# Patient Record
Sex: Female | Born: 1975 | Hispanic: Yes | Marital: Single | Smoking: Never smoker
Health system: Southern US, Community
[De-identification: ages and names within clinical notes are randomized; demographics above are authoritative.]

## PROBLEM LIST (undated history)

## (undated) HISTORY — PX: BREAST BIOPSY: SHX20

---

## 2020-10-23 ENCOUNTER — Ambulatory Visit: Payer: Self-pay | Attending: Oncology | Admitting: *Deleted

## 2020-10-23 ENCOUNTER — Ambulatory Visit
Admission: RE | Admit: 2020-10-23 | Discharge: 2020-10-23 | Disposition: A | Discharge status: Home / Self Care | Payer: Self-pay | Source: Ambulatory Visit | Attending: Oncology | Admitting: Oncology

## 2020-10-23 ENCOUNTER — Encounter: Payer: Self-pay | Admitting: *Deleted

## 2020-10-23 ENCOUNTER — Other Ambulatory Visit: Payer: Self-pay | Admitting: *Deleted

## 2020-10-23 ENCOUNTER — Other Ambulatory Visit: Payer: Self-pay

## 2020-10-23 VITALS — BP 104/67 | HR 75 | Temp 97.2°F | Ht 61.0 in | Wt 151.0 lb

## 2020-10-23 DIAGNOSIS — N63 Unspecified lump in unspecified breast: Secondary | ICD-10-CM

## 2020-10-23 DIAGNOSIS — N6452 Nipple discharge: Secondary | ICD-10-CM

## 2020-10-23 IMAGING — US US BREAST*R* LIMITED INC AXILLA
1 series · 2 of 2 positions shown · non-contrast
Comparison: None.

CLINICAL DATA: Here for evaluation of bilateral dark yellow nipple
discharge which has been present for 1 year. The nipple discharge is
primarily non spontaneous, but occasionally it is noted
spontaneously by the patient. The patient also reports nonfocal
breast pain in the lower and outer right breast.

EXAM:
DIGITAL DIAGNOSTIC BILATERAL MAMMOGRAM WITH TOMOSYNTHESIS AND CAD;
ULTRASOUND LEFT BREAST LIMITED; ULTRASOUND RIGHT BREAST LIMITED
TECHNIQUE: Bilateral digital diagnostic mammography and breast tomosynthesis
was performed. The images were evaluated with computer-aided
detection.; Targeted ultrasound examination of the left breast was
performed; Targeted ultrasound examination of the right breast was
performed

[Series 3: us breast*right* limited inc axilla · 0.06mm/px · 2 of 2 slices shown]
[im 1/2]
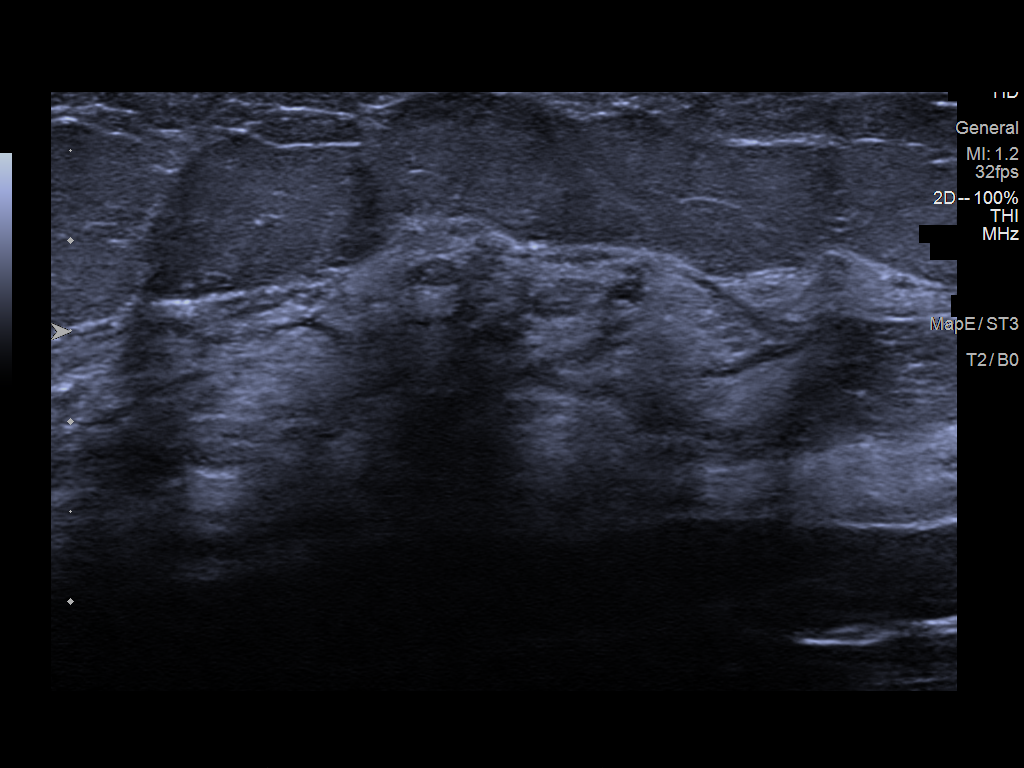
[im 2/2]
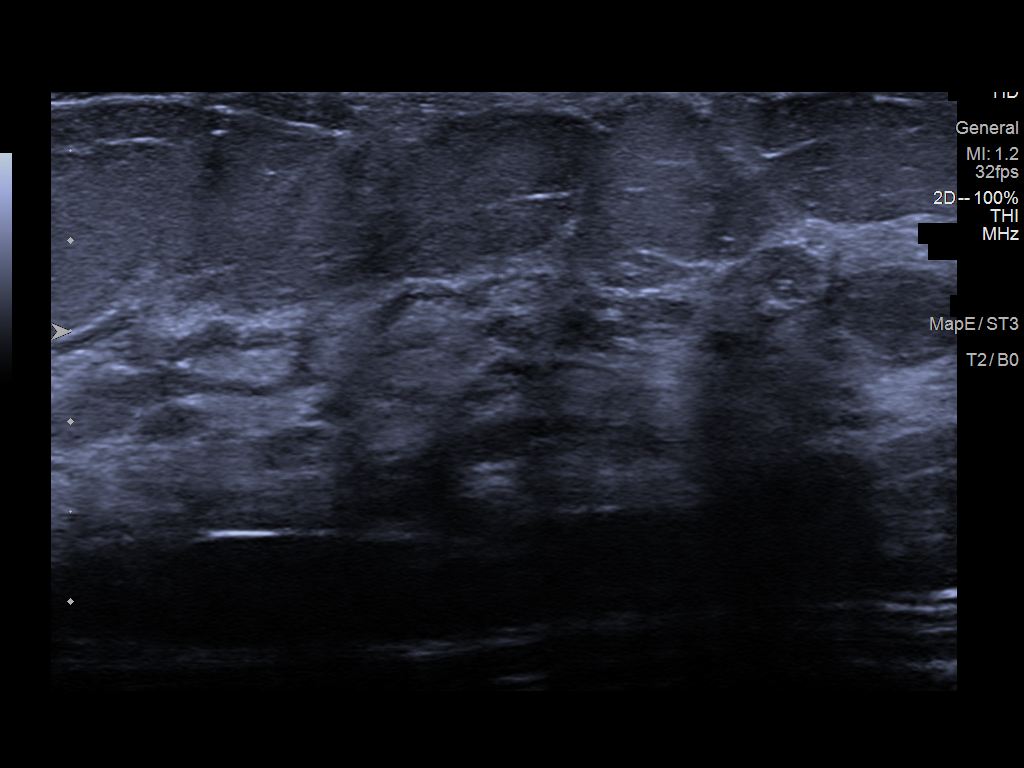

[2 of 2 positions shown; findings below may reference images not displayed]

ACR Breast Density Category c: The breast tissue is heterogeneously
dense, which may obscure small masses.
FINDINGS: Additional views including spot compression demonstrate a 5 mm oval
circumscribed mass in the lower outer left breast at posterior
depth.

Spot compression was obtained in the retroareolar area of both
breasts. No suspicious mammographic finding is identified in these
areas.

No suspicious mammographic finding is identified in the right breast
to explain the patient's symptoms. No suspicious mass,
microcalcification, or other finding is identified in the right
breast.

Targeted left breast ultrasound was performed in the area of the
mammographic mass. At 5 o'clock 2 cm from the nipple a benign simple
cyst measures 7 x 4 x 5 mm. This corresponds to the mass seen on the
mammogram.

Targeted bilateral retroareolar ultrasound was performed.

In the retroareolar left breast at 1 o'clock a hypoechoic
intraductal mass measures 5 x 4 x 4 mm. This does not have a
definite mammographic correlate. No abnormally enlarged left
axillary lymph node is identified.

In the retroareolar right breast, no suspicious solid or cystic mass
is identified. No findings to explain the patient's symptoms.

Targeted right breast ultrasound was performed in the area of pain
by the physician. No suspicious solid or cystic mass is identified.
No findings to explain the patient's symptoms.
IMPRESSION: 1. Hypoechoic intraductal mass in the left breast is suspicious for
malignancy.
2. No evidence of malignancy in the right breast or findings to
explain the patient's symptoms in the right breast.

RECOMMENDATION:
1. Recommend ultrasound-guided left breast biopsy.
2. Any further workup of the patient's symptoms should be based on
the clinical assessment.

I have discussed the findings and recommendations with the patient.
If applicable, a reminder letter will be sent to the patient
regarding the next appointment.

BI-RADS CATEGORY  4: Suspicious.

## 2020-10-23 IMAGING — MG DIGITAL DIAGNOSTIC BILAT W/ TOMO W/ CAD
8 of 16 series · 8 of 40 positions shown · non-contrast
Comparison: None.

CLINICAL DATA: Here for evaluation of bilateral dark yellow nipple
discharge which has been present for 1 year. The nipple discharge is
primarily non spontaneous, but occasionally it is noted
spontaneously by the patient. The patient also reports nonfocal
breast pain in the lower and outer right breast.

EXAM:
DIGITAL DIAGNOSTIC BILATERAL MAMMOGRAM WITH TOMOSYNTHESIS AND CAD;
ULTRASOUND LEFT BREAST LIMITED; ULTRASOUND RIGHT BREAST LIMITED
TECHNIQUE: Bilateral digital diagnostic mammography and breast tomosynthesis
was performed. The images were evaluated with computer-aided
detection.; Targeted ultrasound examination of the left breast was
performed; Targeted ultrasound examination of the right breast was
performed

[L MLO synth-2D (1 of 2)]
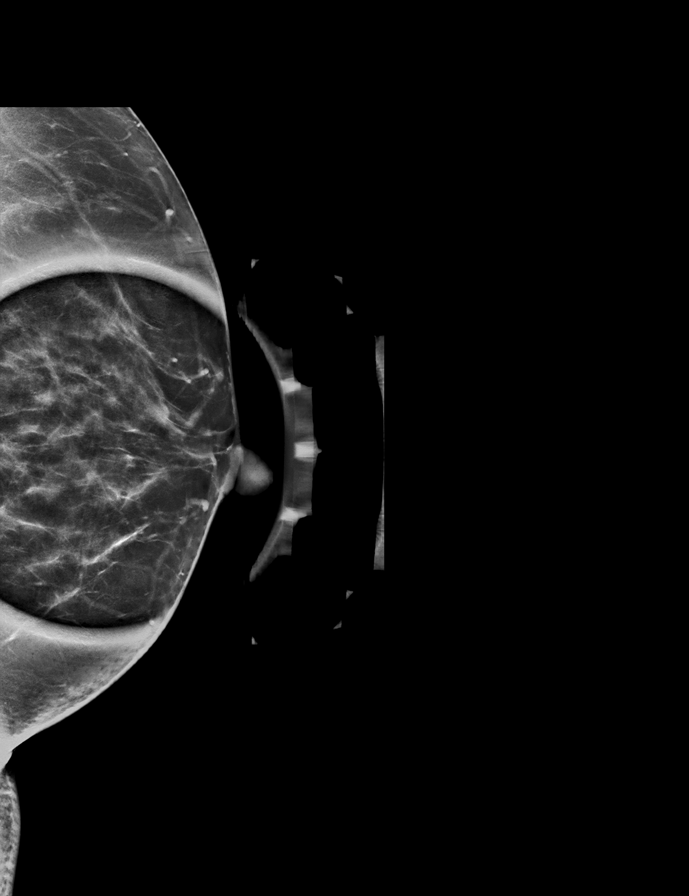

[L MLO synth-2D (2 of 2)]
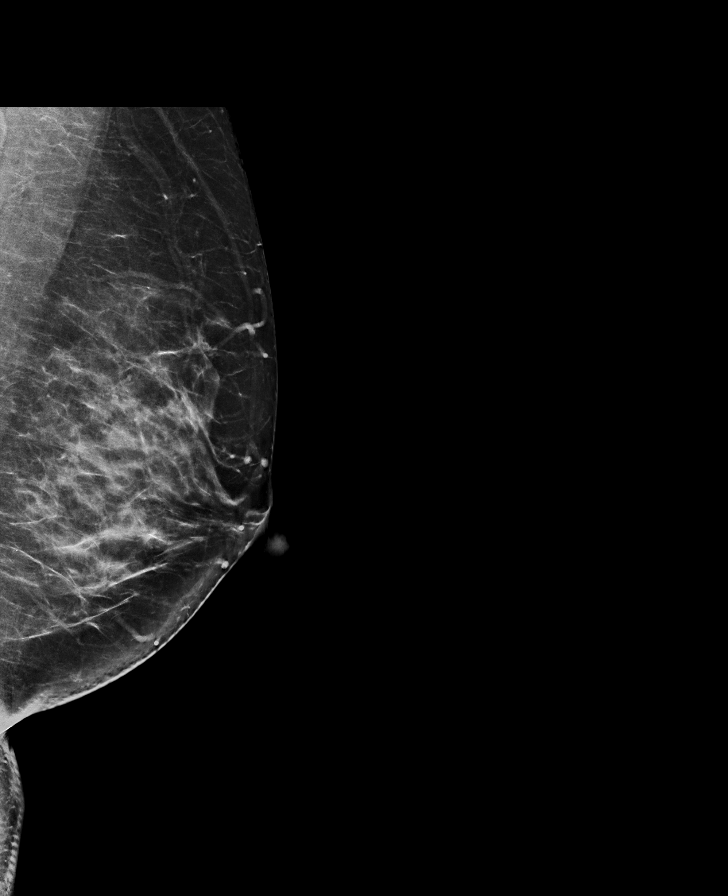

[R MLO synth-2D]
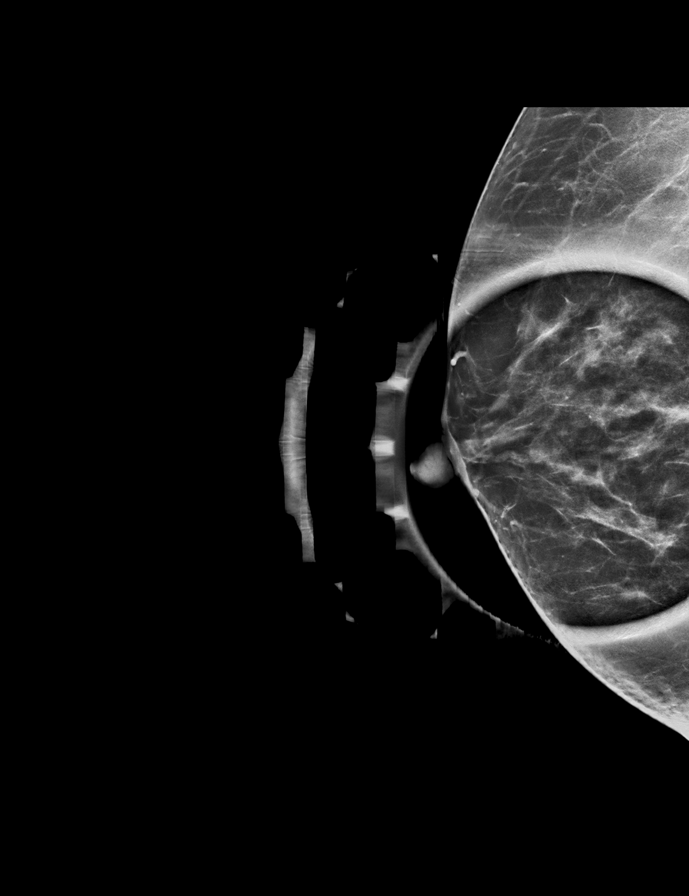

[L ML synth-2D]
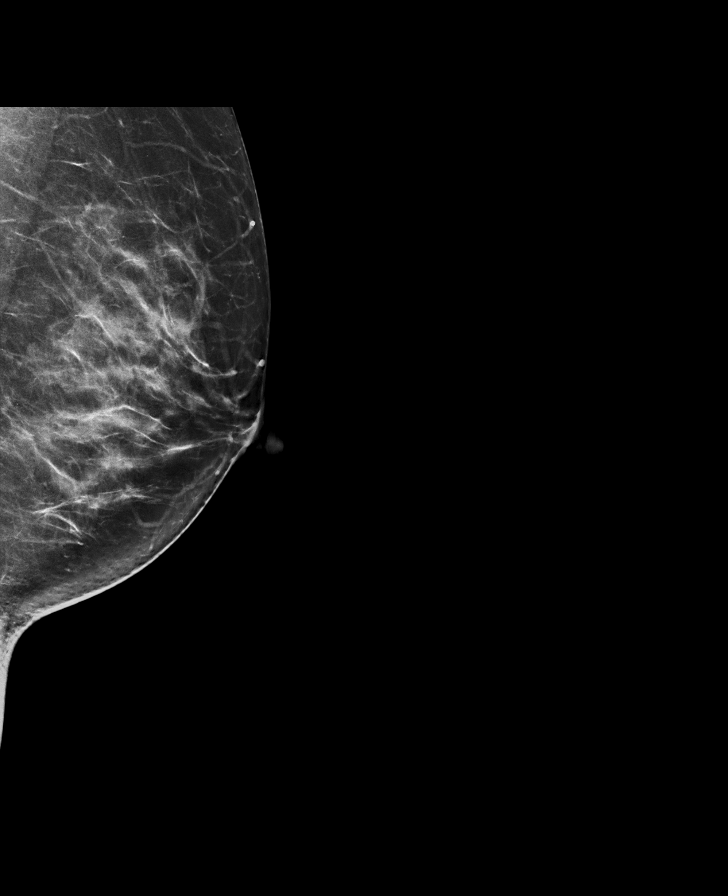

[L CC synth-2D (1 of 2)]
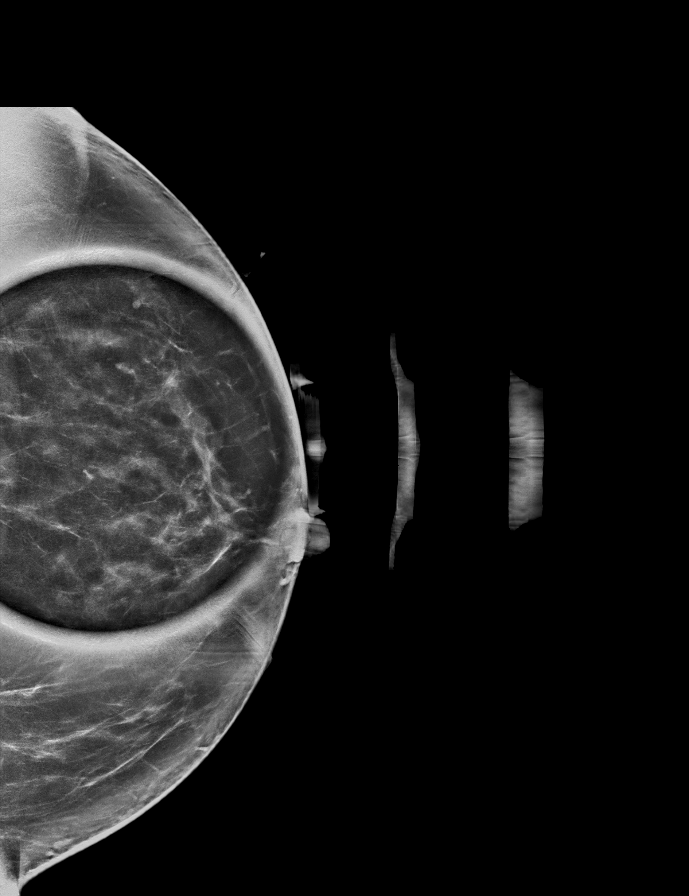

[R CC synth-2D]
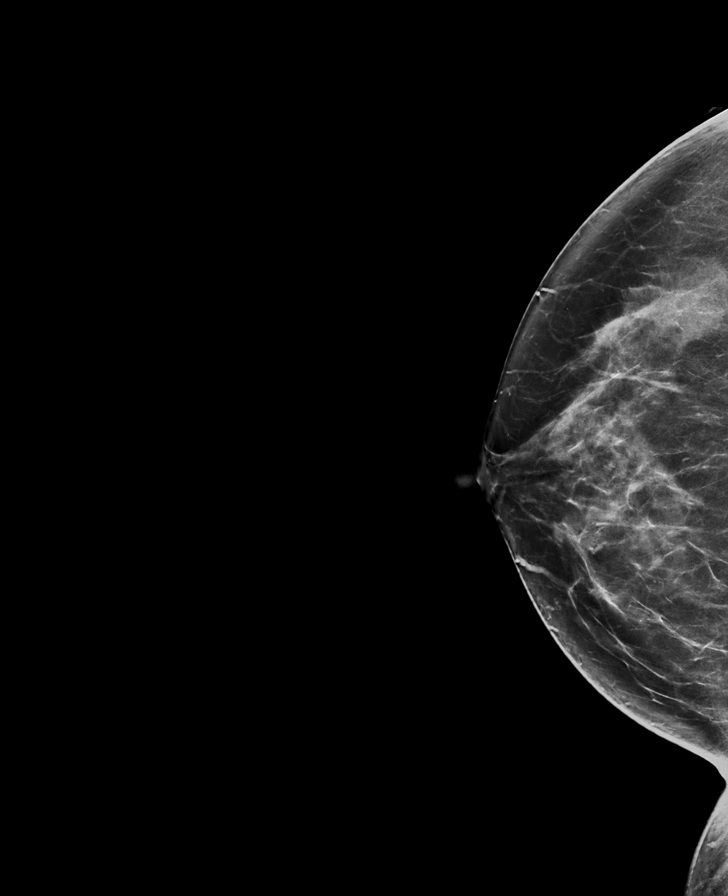

[L CC synth-2D (2 of 2)]
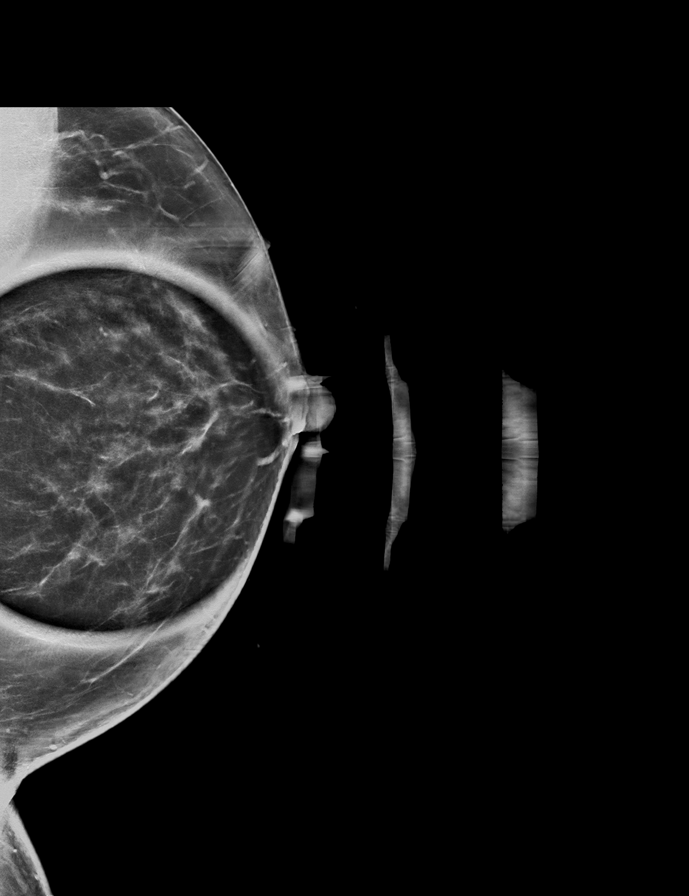

[R MLO tomo · tomo slice 33/65.0]
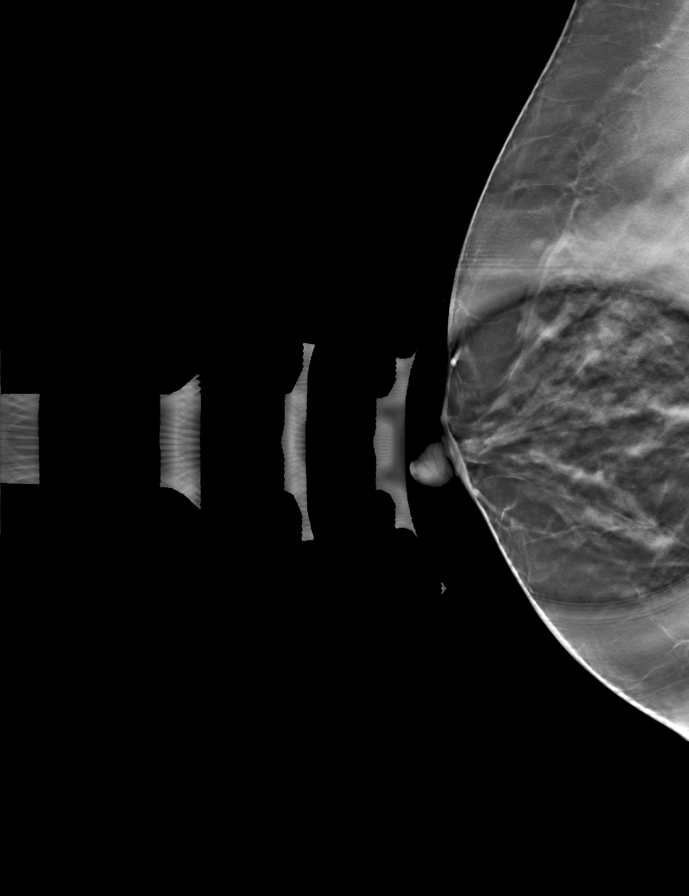

[8 of 40 positions shown; findings below may reference images not displayed]

ACR Breast Density Category c: The breast tissue is heterogeneously
dense, which may obscure small masses.
FINDINGS: Additional views including spot compression demonstrate a 5 mm oval
circumscribed mass in the lower outer left breast at posterior
depth.

Spot compression was obtained in the retroareolar area of both
breasts. No suspicious mammographic finding is identified in these
areas.

No suspicious mammographic finding is identified in the right breast
to explain the patient's symptoms. No suspicious mass,
microcalcification, or other finding is identified in the right
breast.

Targeted left breast ultrasound was performed in the area of the
mammographic mass. At 5 o'clock 2 cm from the nipple a benign simple
cyst measures 7 x 4 x 5 mm. This corresponds to the mass seen on the
mammogram.

Targeted bilateral retroareolar ultrasound was performed.

In the retroareolar left breast at 1 o'clock a hypoechoic
intraductal mass measures 5 x 4 x 4 mm. This does not have a
definite mammographic correlate. No abnormally enlarged left
axillary lymph node is identified.

In the retroareolar right breast, no suspicious solid or cystic mass
is identified. No findings to explain the patient's symptoms.

Targeted right breast ultrasound was performed in the area of pain
by the physician. No suspicious solid or cystic mass is identified.
No findings to explain the patient's symptoms.
IMPRESSION: 1. Hypoechoic intraductal mass in the left breast is suspicious for
malignancy.
2. No evidence of malignancy in the right breast or findings to
explain the patient's symptoms in the right breast.

RECOMMENDATION:
1. Recommend ultrasound-guided left breast biopsy.
2. Any further workup of the patient's symptoms should be based on
the clinical assessment.

I have discussed the findings and recommendations with the patient.
If applicable, a reminder letter will be sent to the patient
regarding the next appointment.

BI-RADS CATEGORY  4: Suspicious.

## 2020-10-23 IMAGING — US US BREAST*L* LIMITED INC AXILLA
1 series · 11 of 11 positions shown · non-contrast
Comparison: None.

CLINICAL DATA: Here for evaluation of bilateral dark yellow nipple
discharge which has been present for 1 year. The nipple discharge is
primarily non spontaneous, but occasionally it is noted
spontaneously by the patient. The patient also reports nonfocal
breast pain in the lower and outer right breast.

EXAM:
DIGITAL DIAGNOSTIC BILATERAL MAMMOGRAM WITH TOMOSYNTHESIS AND CAD;
ULTRASOUND LEFT BREAST LIMITED; ULTRASOUND RIGHT BREAST LIMITED
TECHNIQUE: Bilateral digital diagnostic mammography and breast tomosynthesis
was performed. The images were evaluated with computer-aided
detection.; Targeted ultrasound examination of the left breast was
performed; Targeted ultrasound examination of the right breast was
performed

[Series 1: us breast*left* limited inc axilla · 0.07mm/px · 11 of 11 slices shown]
[im 1/11]
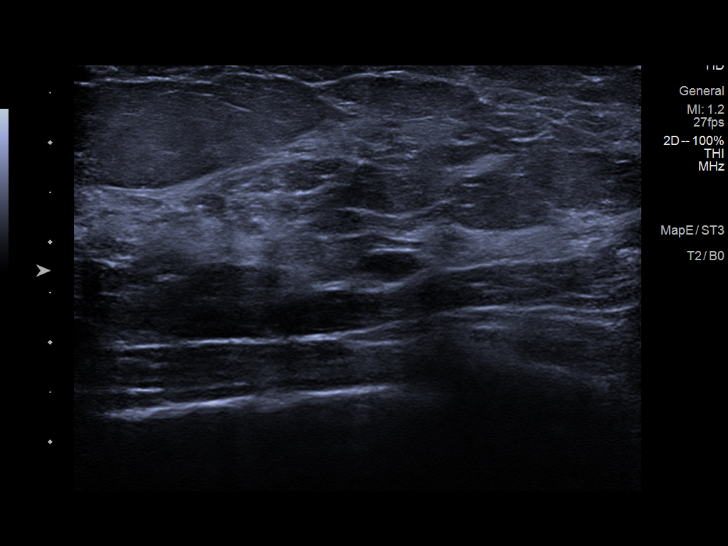
[im 2/11]
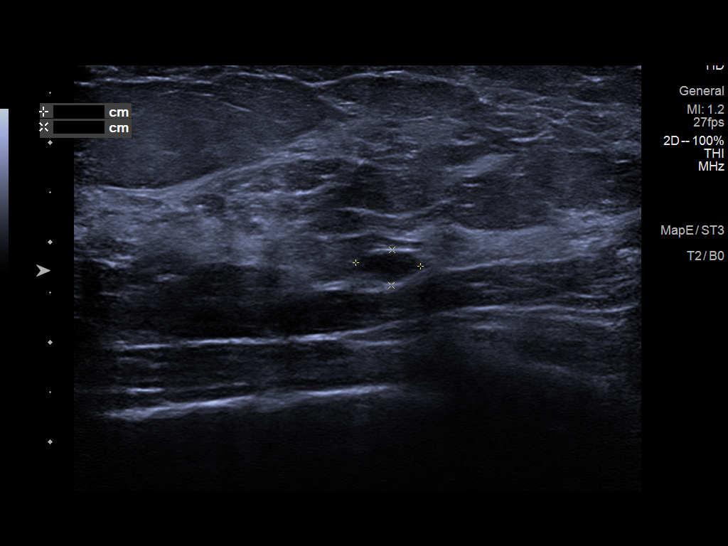
[im 3/11]
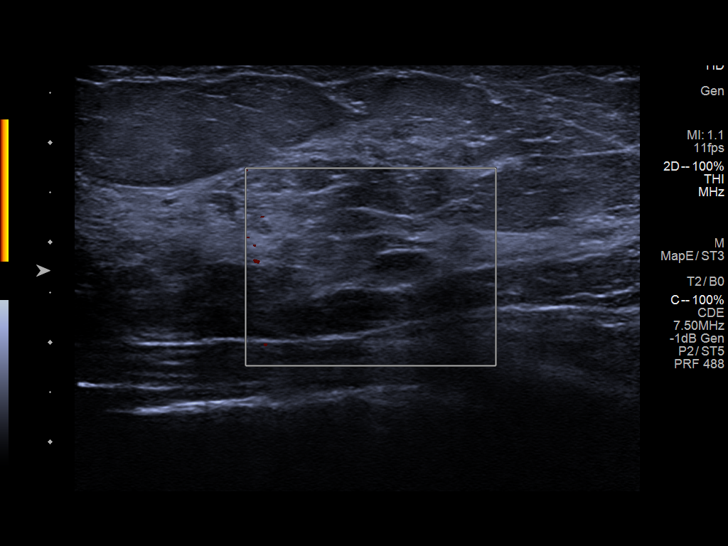
[im 4/11]
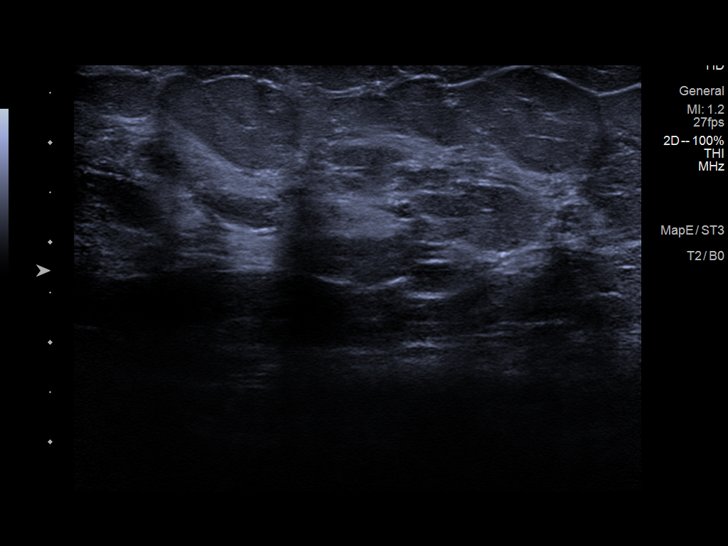
[im 5/11]
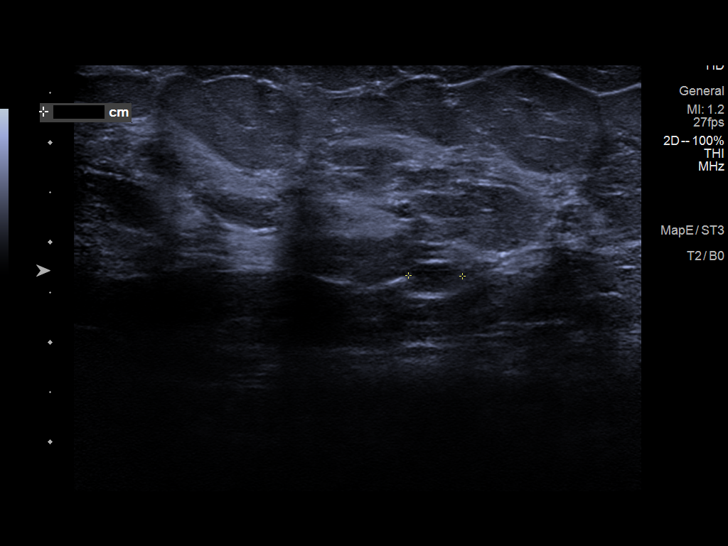
[im 6/11]
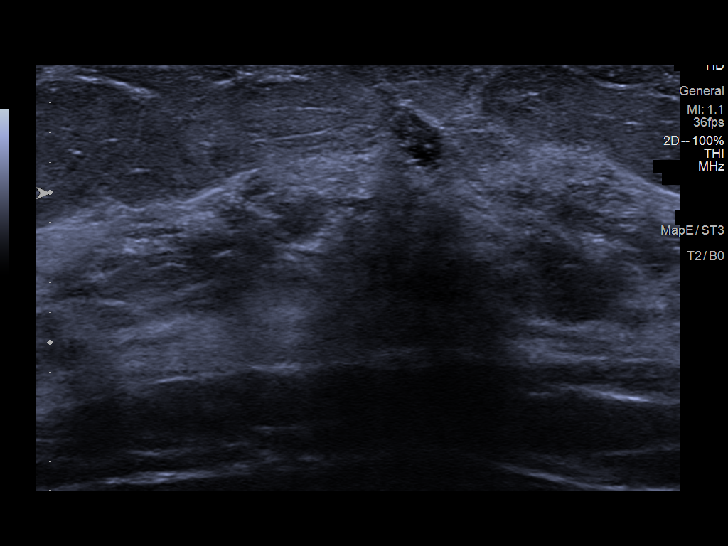
[im 7/11]
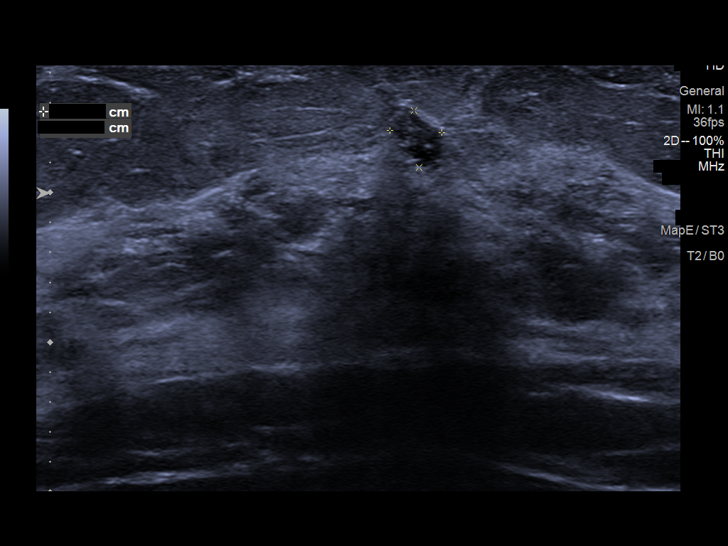
[im 8/11]
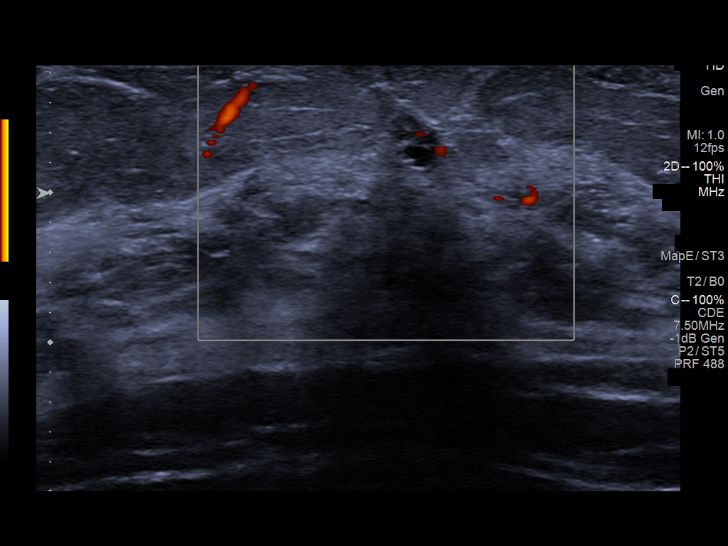
[im 9/11]
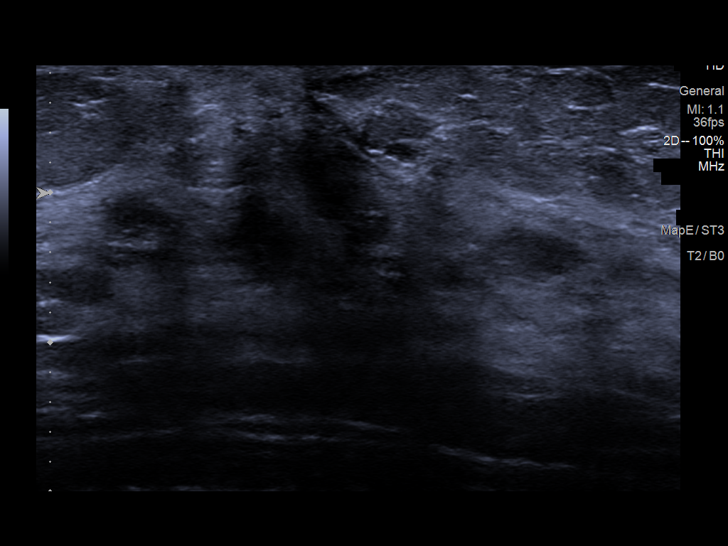
[im 10/11]
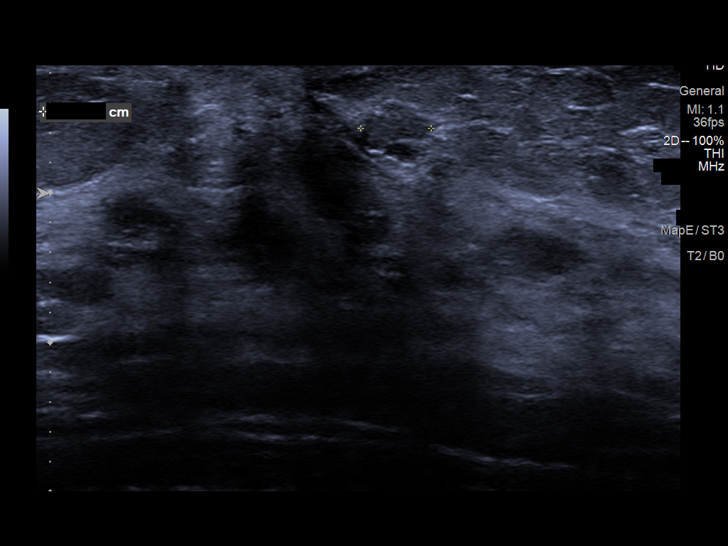
[im 11/11]
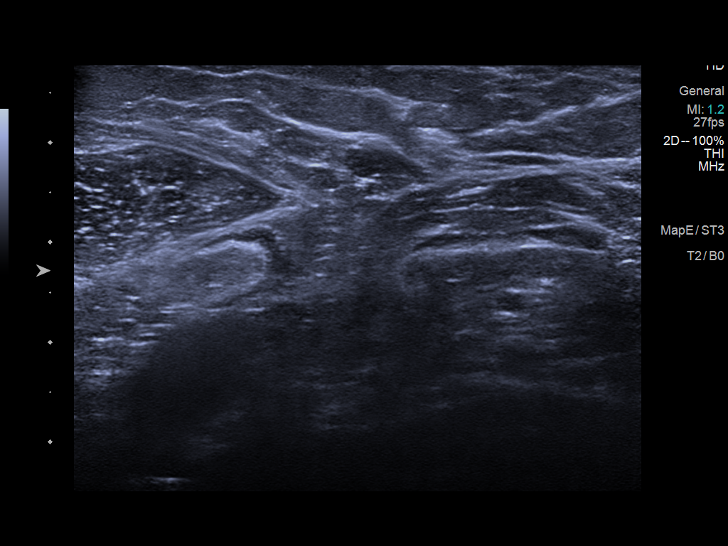

[11 of 11 positions shown; findings below may reference images not displayed]

ACR Breast Density Category c: The breast tissue is heterogeneously
dense, which may obscure small masses.
FINDINGS: Additional views including spot compression demonstrate a 5 mm oval
circumscribed mass in the lower outer left breast at posterior
depth.

Spot compression was obtained in the retroareolar area of both
breasts. No suspicious mammographic finding is identified in these
areas.

No suspicious mammographic finding is identified in the right breast
to explain the patient's symptoms. No suspicious mass,
microcalcification, or other finding is identified in the right
breast.

Targeted left breast ultrasound was performed in the area of the
mammographic mass. At 5 o'clock 2 cm from the nipple a benign simple
cyst measures 7 x 4 x 5 mm. This corresponds to the mass seen on the
mammogram.

Targeted bilateral retroareolar ultrasound was performed.

In the retroareolar left breast at 1 o'clock a hypoechoic
intraductal mass measures 5 x 4 x 4 mm. This does not have a
definite mammographic correlate. No abnormally enlarged left
axillary lymph node is identified.

In the retroareolar right breast, no suspicious solid or cystic mass
is identified. No findings to explain the patient's symptoms.

Targeted right breast ultrasound was performed in the area of pain
by the physician. No suspicious solid or cystic mass is identified.
No findings to explain the patient's symptoms.
IMPRESSION: 1. Hypoechoic intraductal mass in the left breast is suspicious for
malignancy.
2. No evidence of malignancy in the right breast or findings to
explain the patient's symptoms in the right breast.

RECOMMENDATION:
1. Recommend ultrasound-guided left breast biopsy.
2. Any further workup of the patient's symptoms should be based on
the clinical assessment.

I have discussed the findings and recommendations with the patient.
If applicable, a reminder letter will be sent to the patient
regarding the next appointment.

BI-RADS CATEGORY  4: Suspicious.

## 2020-10-23 NOTE — Progress Notes (Signed)
Subjective:     Patient ID: Sally Ellis, female   DOB: 04-Nov-1975, 45 y.o.   MRN: [id number]  HPI   [facility] Medical History Record - 10/23/20 2725      Breast History   Screening cycle New    Provider ([facility]) [facility]    Initial Mammogram 10/23/20    Last Mammogram Never    Recent Breast Symptoms Pain   Bilateral pain more right; Milky nipple discharge expressed only     Breast Cancer History   Breast Cancer History No personal or family history    Comments/Details father prostate      Previous History of Breast Problems   Breast Surgery or Biopsy None    Breast Implants N/A    BSE Done Never      Gynecological/Obstetrical History   LMP --   2 years   Is there any chance that the client could be pregnant?  No    Age at menarche 71    Age at menopause perimenopausal  Possibly Menopausal    PAP smear history Annually    Date of last PAP  --   10/2020   Provider (PAP) [facility]    Age at first live birth 77    Breast fed children Yes (type length in comments)   1 year   DES Exposure No    Cervical, Uterine or Ovarian cancer No    Family history of Cervial, Uterine or Ovarian cancer Yes   p- aunts 2 pelvic   Hysterectomy No    Cervix removed No    Ovaries removed No    Laser/Cryosurgery No   2 years ago sent to [facility] for abnormal pap colposcopy; benign biopsy?   Current method of birth control None    Current method of Estrogen/Hormone replacement None    Smoking history None            Review of Systems     Objective:   Physical Exam Chest:  Breasts:     Right: Nipple discharge present. No swelling, bleeding, inverted nipple, mass, skin change, tenderness, axillary adenopathy or supraclavicular adenopathy.     Left: Nipple discharge present. No swelling, bleeding, inverted nipple, mass, skin change, axillary adenopathy or supraclavicular adenopathy.     Lymphadenopathy:     Upper Body:     Right upper body: No  supraclavicular or axillary adenopathy.     Left upper body: No supraclavicular or axillary adenopathy.        Assessment:     45 year old Hispanic female referred to [facility] by the [facility] for further evaluation of bilateral breast masses and nipple discharge.  Jacqui, the interpreter present during the interview and exam.  Patient states she has intermittent bilateral breast masses and nipple discharge on expression only.  States this has been going on for about 1 year.  On clinical breast exam there is no dominant mass, skin changes, lymphadenopathy or spontaneous nipple discharge.  I asked the patient to find the area of palpable concern.  She is not able to find a mass on either breast.  States "they come and go".  She is able to express some clear/tan nipple discharge from a single duct from each nipple.  Taught self breast awareness.    Risk Assessment    Risk Scores      10/23/2020   Last edited by: Scarlett Presto, RN   5-year risk: 0.5 %   Lifetime risk: 5.6 %  Plan:     Will get bilateral diagnostic mammogram and ultrasound.  Will follow up per [facility] protocol.

## 2020-10-23 NOTE — Patient Instructions (Signed)
Gave patient hand-out, Women Staying Healthy, Active and Well from [facility], with education on breast health, pap smears, heart and colon health. 

## 2020-11-01 ENCOUNTER — Ambulatory Visit: Payer: Self-pay

## 2020-11-12 ENCOUNTER — Ambulatory Visit
Admission: RE | Admit: 2020-11-12 | Discharge: 2020-11-12 | Disposition: A | Discharge status: Home / Self Care | Payer: Self-pay | Source: Ambulatory Visit | Attending: Oncology | Admitting: Oncology

## 2020-11-12 ENCOUNTER — Ambulatory Visit: Payer: Self-pay

## 2020-11-12 ENCOUNTER — Other Ambulatory Visit: Payer: Self-pay | Admitting: *Deleted

## 2020-11-12 ENCOUNTER — Other Ambulatory Visit: Payer: Self-pay

## 2020-11-12 DIAGNOSIS — N63 Unspecified lump in unspecified breast: Secondary | ICD-10-CM

## 2020-11-12 HISTORY — PX: BREAST BIOPSY: SHX20

## 2020-11-12 IMAGING — MG DIGITAL DIAGNOSTIC UNILAT LEFT W/ CAD
4 series · 4 of 12 positions shown · non-contrast
Comparison: Previous exam(s).

CLINICAL DATA: Patient status post ultrasound-guided core needle
biopsy retroareolar left breast mass.

EXAM:
DIAGNOSTIC LEFT MAMMOGRAM POST ULTRASOUND BIOPSY

[L ML synth-2D]
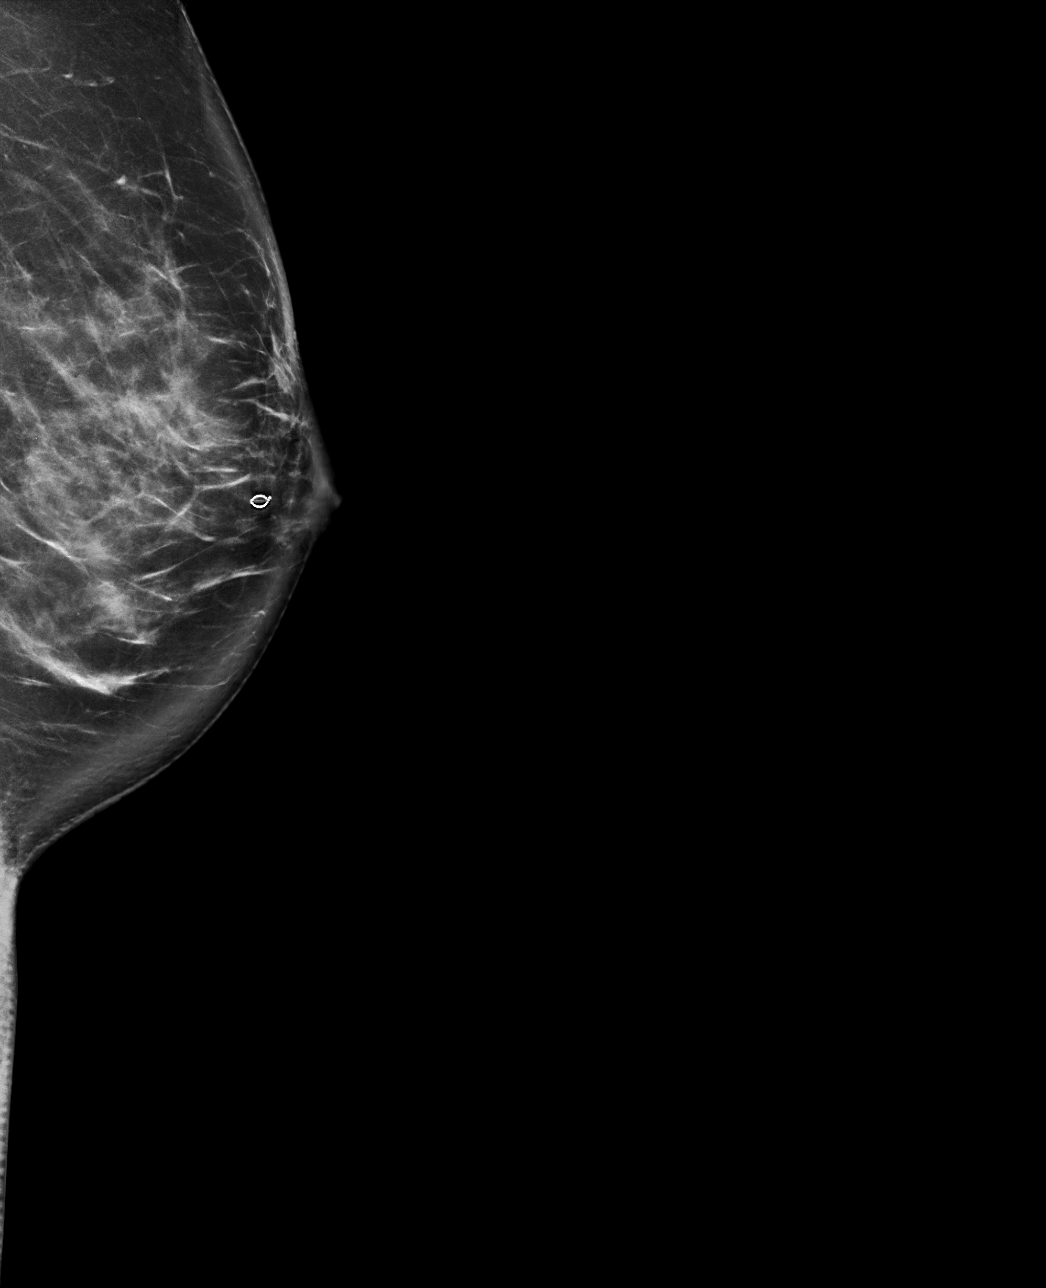

[L CC synth-2D]
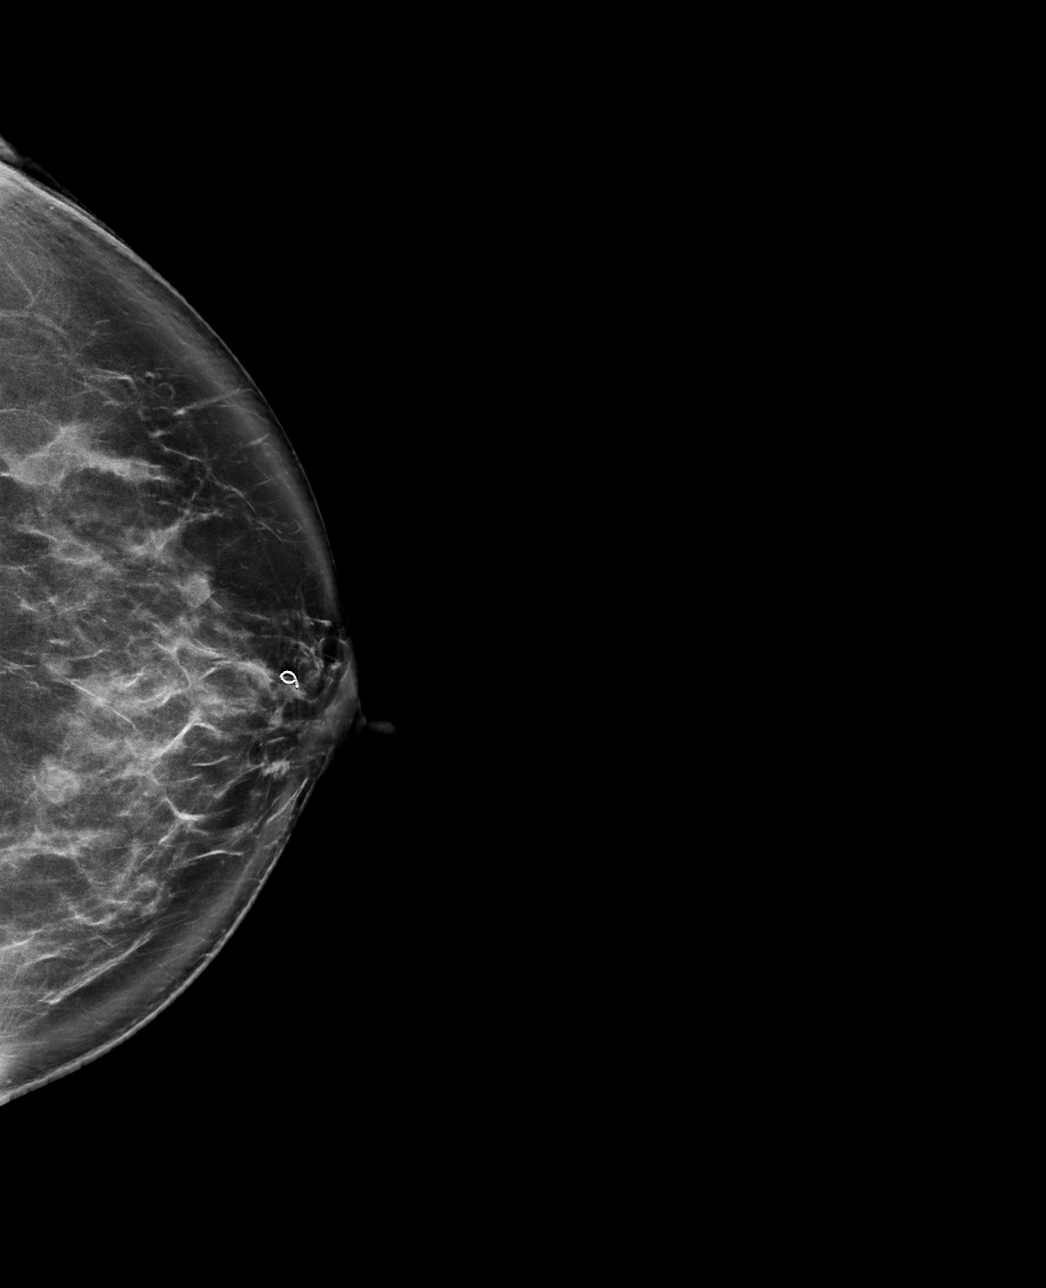

[L ML tomo · tomo slice 53/105.0]
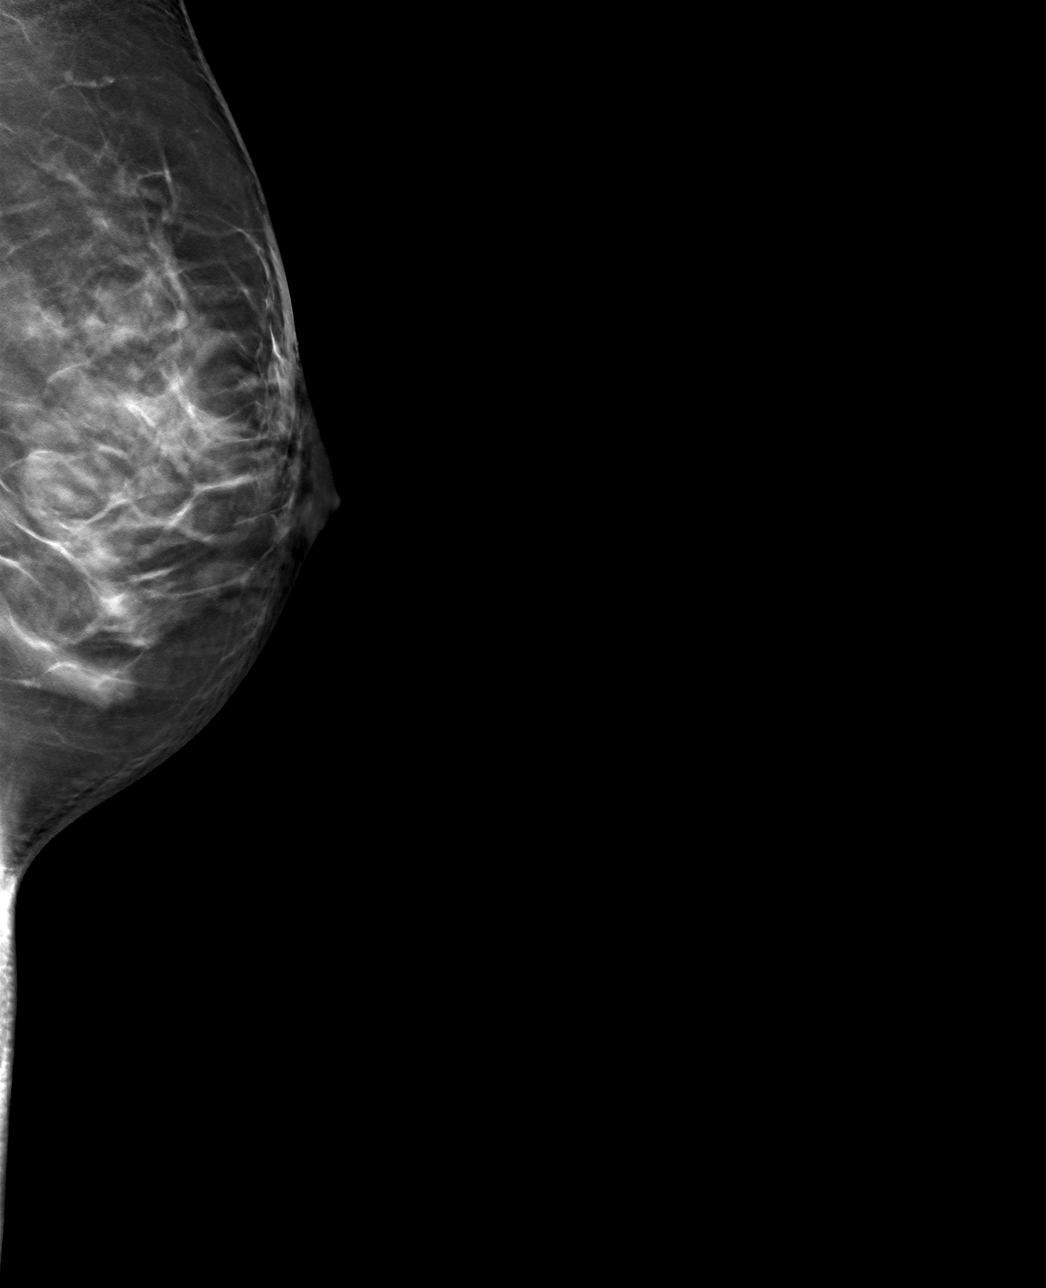

[L CC tomo · tomo slice 53/105.0]
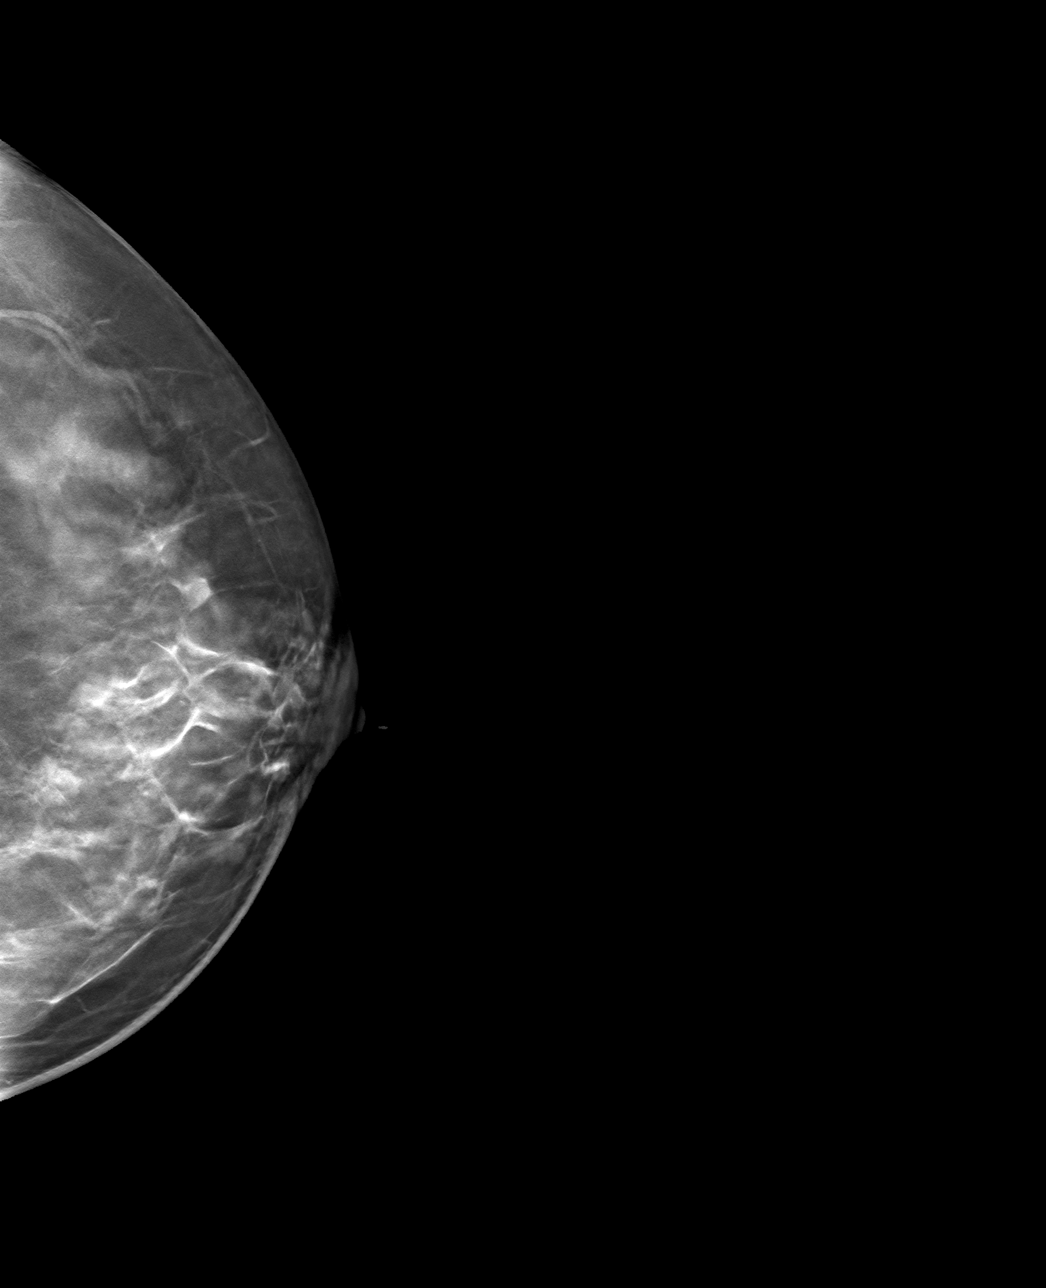

[4 of 12 positions shown; findings below may reference images not displayed]

FINDINGS: Mammographic images were obtained following ultrasound guided biopsy
of left breast mass 1 o'clock position. The biopsy marking clip is
in expected position at the site of biopsy.
IMPRESSION: Appropriate positioning of the Q shaped biopsy marking clip at the
site of biopsy in the left breast mass 1 o'clock position.

Final Assessment: Post Procedure Mammograms for Marker Placement

## 2020-11-12 IMAGING — MG US BREAST BX W LOC DEV 1ST LESION IMG BX SPEC US GUIDE*L*
1 series · 5 of 5 positions shown · non-contrast
Comparison: Previous exam(s).
COMPARISON: Previous exam(s).

Addendum:
CLINICAL DATA: Patient with indeterminate mass left breast 1
o'clock position.

EXAM:
ULTRASOUND GUIDED LEFT BREAST CORE NEEDLE BIOPSY

[Series 1: MG view · 0.05mm/px · 5 of 5 slices shown]
[im 1/5]
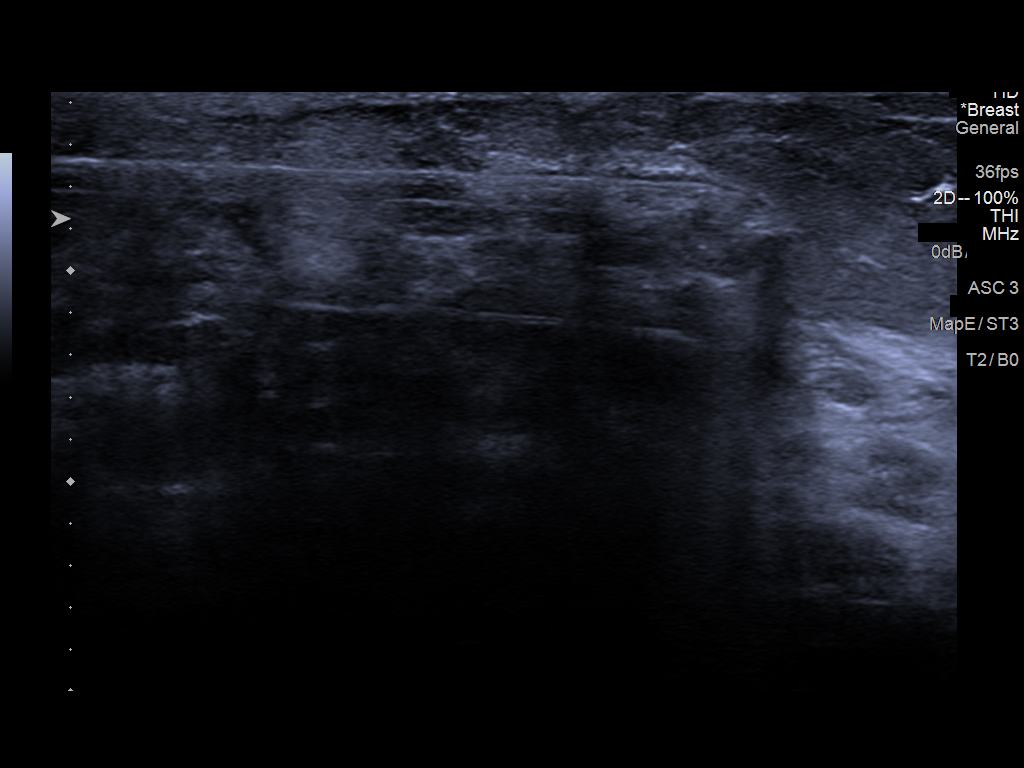
[im 2/5]
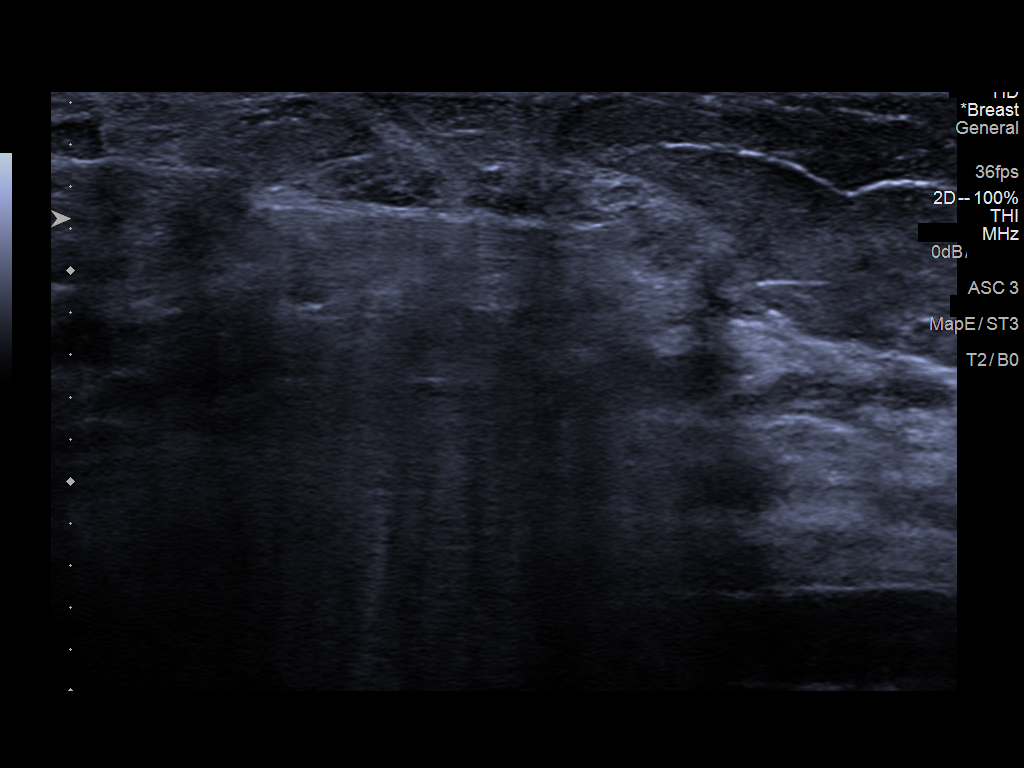
[im 3/5]
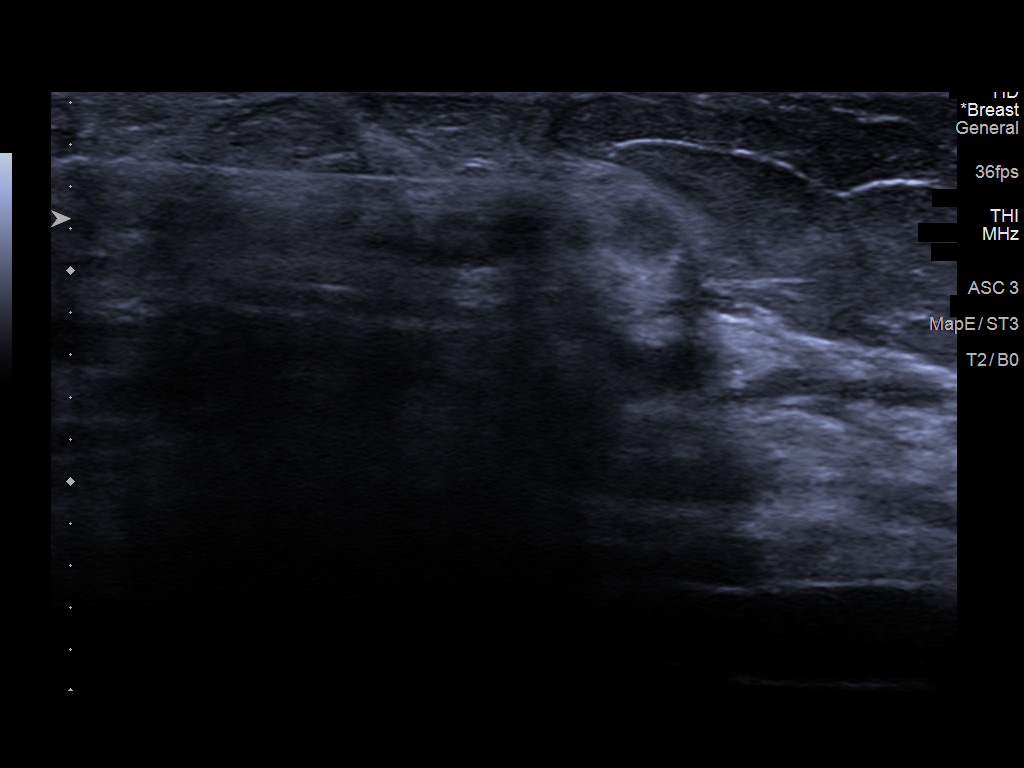
[im 4/5]
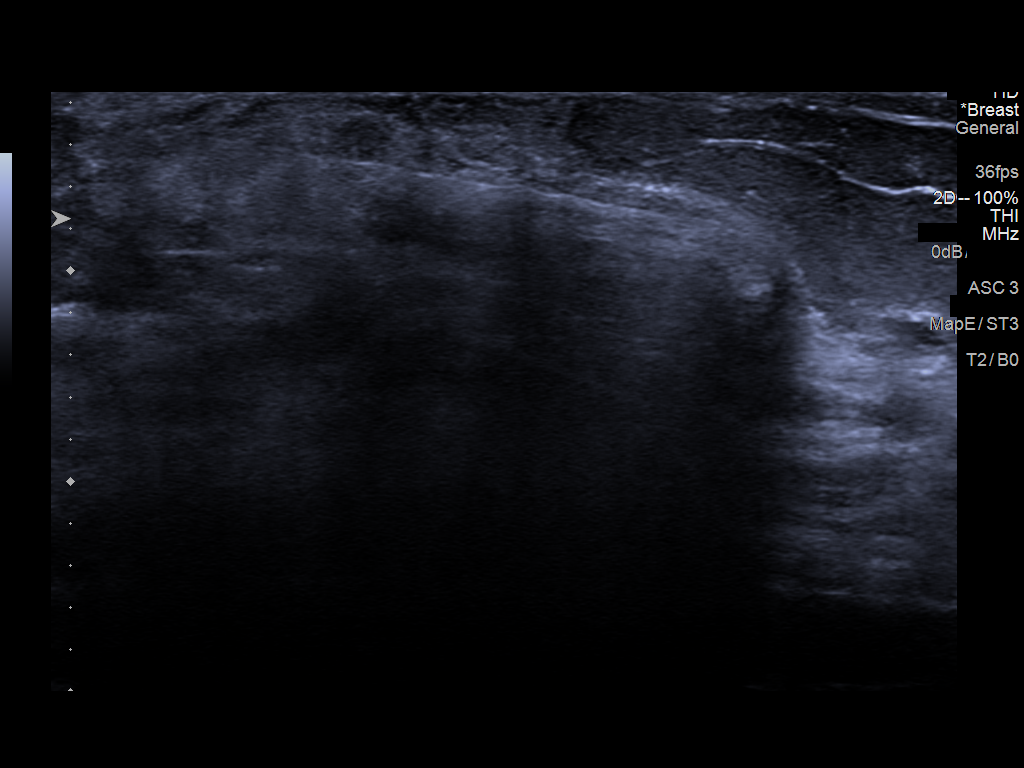
[im 5/5]
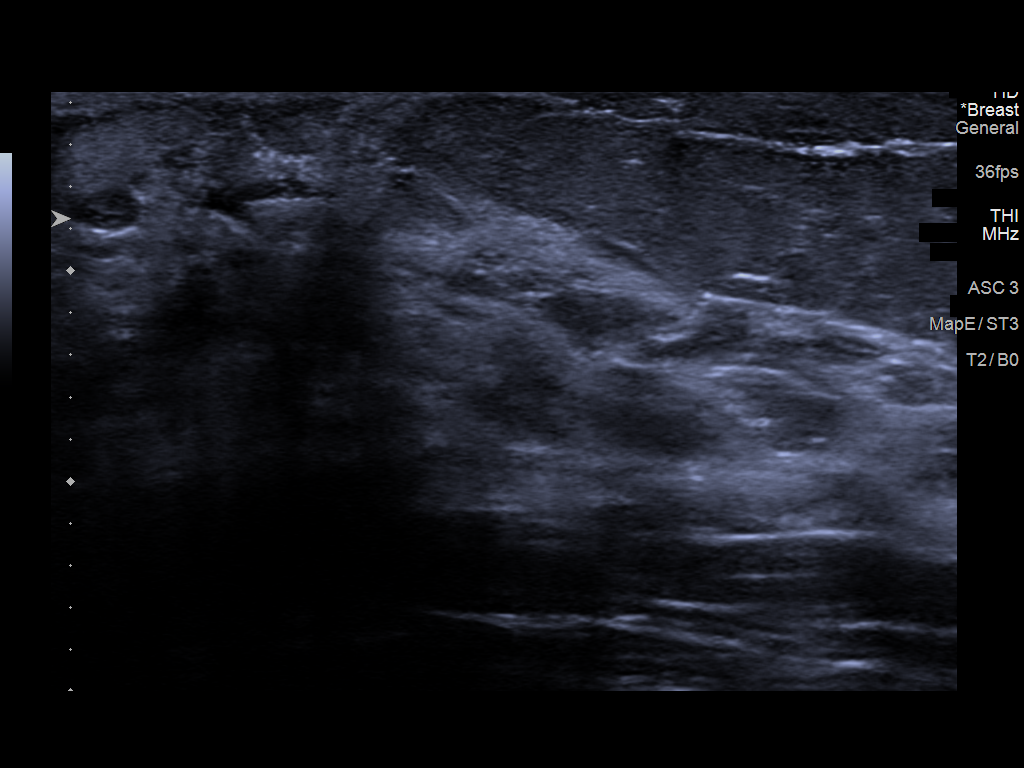

[5 of 5 positions shown; findings below may reference images not displayed]



Lesion quadrant: Upper outer quadrant

Using sterile technique and 1% Lidocaine as local anesthetic, under
direct ultrasound visualization, a 14 gauge SCHILL device was
used to perform biopsy of left breast mass 1 o'clock position using
a lateral approach. At the conclusion of the procedure Q shaped
tissue marker clip was deployed into the biopsy cavity. Follow up 2
view mammogram was performed and dictated separately.
IMPRESSION: Ultrasound guided biopsy of left breast mass 1 o'clock position. No
apparent complications.

ADDENDUM:
PATHOLOGY revealed: A. LEFT BREAST, RETROAREOLAR; ULTRASOUND-GUIDED
BIOPSY: - BENIGN BREAST TISSUE WITH FIBROCYSTIC CHANGES AND
EXTENSIVE PAPILLARY APOCRINE METAPLASIA. - NEGATIVE FOR ATYPIA AND
MALIGNANCY.

Pathology results are CONCORDANT with imaging findings, per Dr. SCHILL
SCHILL.

Pathology results and recommendations below were discussed with
patient via [employer] # [[phone]] by telephone on [DATE].
Patient reported biopsy site within normal limits with slight
tenderness at the site, and no significant bruising. Patient stated
she continues to have small amount of LEFT nipple discharge. Post
biopsy care instructions were reviewed, questions were answered and
my direct phone number was provided to patient. Patient was
instructed to call [HOSPITAL] if any concerns or
questions arise related to the biopsy.

Recommendation: RECOMMEND bilateral breast MRI due to persistent
LEFT breast nipple discharge with benign biopsy results.

Pathology results reported by SCHILL RN on [DATE].



Lesion quadrant: Upper outer quadrant

Using sterile technique and 1% Lidocaine as local anesthetic, under
direct ultrasound visualization, a 14 gauge SCHILL device was
used to perform biopsy of left breast mass 1 o'clock position using
a lateral approach. At the conclusion of the procedure Q shaped
tissue marker clip was deployed into the biopsy cavity. Follow up 2
view mammogram was performed and dictated separately.
IMPRESSION: Ultrasound guided biopsy of left breast mass 1 o'clock position. No
apparent complications.

## 2020-11-13 LAB — SURGICAL PATHOLOGY

## 2020-11-22 ENCOUNTER — Other Ambulatory Visit: Payer: Self-pay | Admitting: *Deleted

## 2020-11-22 DIAGNOSIS — N6452 Nipple discharge: Secondary | ICD-10-CM

## 2020-11-25 ENCOUNTER — Telehealth: Payer: Self-pay | Admitting: *Deleted

## 2020-11-25 NOTE — Telephone Encounter (Signed)
VM TO SCHD BIL BR MRI

## 2020-12-24 ENCOUNTER — Ambulatory Visit: Admission: RE | Admit: 2020-12-24 | Payer: Self-pay | Source: Ambulatory Visit

## 2021-01-23 ENCOUNTER — Encounter: Payer: Self-pay | Admitting: *Deleted

## 2021-01-23 NOTE — Progress Notes (Signed)
Spoke to patient today via [employer] manager [phone].  Patient states she cancelled her MRI appointment because she had received bills for her mammogram.  She was instructed to bring her bills to the cancer center so I could correct the billing.  She is agreeable to reschedule her breast MRI.  She is call or bring me bill for her MRI if she receives one.  Inbasket message sent to Madelynn Done in the breast center to schedule patient's MRI.

## 2021-02-12 ENCOUNTER — Other Ambulatory Visit: Payer: Self-pay

## 2021-02-12 ENCOUNTER — Ambulatory Visit
Admission: RE | Admit: 2021-02-12 | Discharge: 2021-02-12 | Disposition: A | Discharge status: Home / Self Care | Payer: Self-pay | Source: Ambulatory Visit | Attending: Oncology | Admitting: Oncology

## 2021-02-12 DIAGNOSIS — N6452 Nipple discharge: Secondary | ICD-10-CM

## 2021-02-12 IMAGING — MR MR BREAST BILAT WO/W CM
2 of 13 series · 6 of 48 positions shown · IV contrast (7ml Gadavist)
Comparison: Bilateral diagnostic mammogram and bilateral breast
ultrasound dated [DATE] and left breast ultrasound-guided core
needle biopsy dated [DATE].

CLINICAL DATA: Bilateral non spontaneous dark brown nipple
discharge for the past 5 years. At the time of a bilateral
diagnostic mammogram and breast ultrasound dated [DATE], the
patient reported bilateral dark yellow nipple discharge for the year
which was primarily non spontaneous SPECT occasionally noted
spontaneously by the patient. At that time, the patient had 5 mm
intraductal mass in the 1 o'clock position of retroareolar left
breast that was biopsied under ultrasound guidance on [DATE]
with pathology results of benign breast tissue with fibrocystic
changes and extensive papillary apocrine metaplasia.

LABS:  None obtained on site today.
EXAM:
BILATERAL BREAST MRI WITH AND WITHOUT CONTRAST
TECHNIQUE: Multiplanar, multisequence MR images of both breasts were obtained
prior to and following the intravenous administration of 7.5 ml of
Gadavist

[Series 2: T1 · axial · B · 1.5mm · 1.02mm/px · z∈[-46,+120]mm · 5 of 112 slices shown]
[im 1/112]
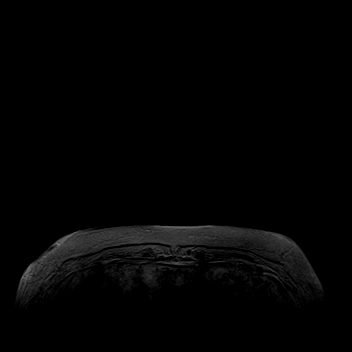
[im 28/112]
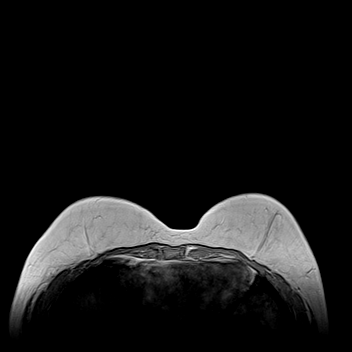
[im 56/112]
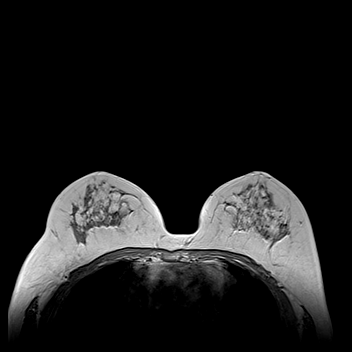
[im 84/112]
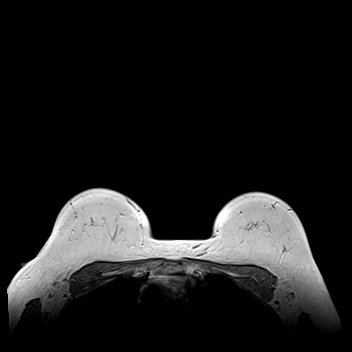
[im 112/112]
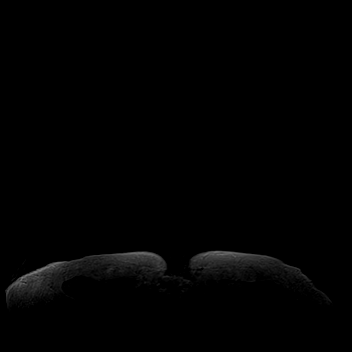

[Series 3: T2 · axial · B · 3.0mm · 1.02mm/px · 1 of 46 slices shown]
[im 1/46]
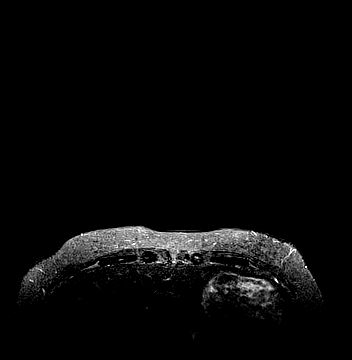

[6 of 48 positions shown; findings below may reference images not displayed]

Three-dimensional MR images were rendered by post-processing of the
original MR data on an independent workstation. The
three-dimensional MR images were interpreted, and findings are
reported in the following complete MRI report for this study. Three
dimensional images were evaluated at the independent interpreting
workstation using the DynaCAD thin client.
FINDINGS: Breast composition: c. Heterogeneous fibroglandular tissue.

Background parenchymal enhancement: Mild bilateral nodular
background parenchymal enhancement.

Right breast: Enhancing mass-like area in 12 o'clock retroareolar
right breast measuring 1.3 x 1.1 cm on image number 70/6. This
measures in 1.2 cm cephalo caudal dimension has persistent
enhancement kinetics. There is also a small amount patchy non mass
enhancement in the medial right breast with appearance compatible
with normal fibroglandular tissue or fibrocystic tissue. Also noted
are multiple small cysts.

Left breast: Areas of patchy non mass enhancement in the central
breast, similar to the small area seen in the medial right breast
except larger on the left. One of these slightly medially has a more
linear branching configuration, measuring 1.2 x 1.1 cm on image
number 65/6. This measures 1.2 cm in cephalo caudal dimension with
low-grade persistent enhancement kinetics. Also noted are multiple
small cysts.

Lymph nodes: No abnormal appearing lymph nodes.

Ancillary findings:  None.
IMPRESSION: 1. 1.3 x 1.2 x 1.1 cm nonspecific enhancing mass-like area in the 12
o'clock retroareolar right breast.
2. 1.2 x 1.2 x 1.1 cm nonspecific linear branching non mass
enhancement slightly medial central left breast. This is suspicious
for the possibility of DCIS.

RECOMMENDATION:
1. MR guided core needle biopsy of the 1 3 cm enhancing mass-like
area in the 12 o'clock retroareolar right breast.
2. MR guided core needle biopsy of the 1.2 cm nonspecific linear
branching non mass enhancement in the slightly medial central left
breast.

BI-RADS CATEGORY  4: Suspicious.

## 2021-02-12 MED ORDER — GADOBUTROL 1 MMOL/ML IV SOLN
7.0000 mL | Freq: Once | INTRAVENOUS | Status: AC | PRN
  Administered 2021-02-12: 16:00:00 7 mL via INTRAVENOUS

## 2021-02-13 ENCOUNTER — Other Ambulatory Visit: Payer: Self-pay | Admitting: *Deleted

## 2021-02-13 DIAGNOSIS — N63 Unspecified lump in unspecified breast: Secondary | ICD-10-CM

## 2021-02-14 ENCOUNTER — Encounter: Payer: Self-pay | Admitting: *Deleted

## 2021-02-14 ENCOUNTER — Other Ambulatory Visit: Payer: Self-pay | Admitting: Oncology

## 2021-02-14 DIAGNOSIS — N63 Unspecified lump in unspecified breast: Secondary | ICD-10-CM

## 2021-02-14 NOTE — Progress Notes (Signed)
Patient with a birads 4 MRI.  Scheduled patient for an MRI guided biopsy on 02/24/21 at 6:40 at [address]   She is to follow previous MRI instructions.  Called patient via Severiano Gilbert #[phone] at [facility] interpreters to inform of her appointment.  No answer.  Will have Joellyn Quails call patient next week.

## 2021-02-24 ENCOUNTER — Ambulatory Visit
Admission: RE | Admit: 2021-02-24 | Discharge: 2021-02-24 | Disposition: A | Discharge status: Home / Self Care | Payer: Self-pay | Source: Ambulatory Visit | Attending: Oncology | Admitting: Oncology

## 2021-02-24 ENCOUNTER — Ambulatory Visit
Admission: RE | Admit: 2021-02-24 | Discharge: 2021-02-24 | Disposition: A | Discharge status: Home / Self Care | Payer: No Typology Code available for payment source | Source: Ambulatory Visit | Attending: Oncology | Admitting: Oncology

## 2021-02-24 ENCOUNTER — Other Ambulatory Visit: Payer: Self-pay

## 2021-02-24 DIAGNOSIS — N63 Unspecified lump in unspecified breast: Secondary | ICD-10-CM

## 2021-02-24 HISTORY — PX: BREAST BIOPSY: SHX20

## 2021-02-24 IMAGING — MG MM BREAST LOCALIZATION CLIP
4 series · 4 of 12 positions shown · non-contrast
Comparison: Previous exam(s).

CLINICAL DATA: Status post MR guided core biopsies of both breast.

EXAM:
3D DIAGNOSTIC BILATERAL MAMMOGRAM POST MRI BIOPSIES

[R CC synth-2D]
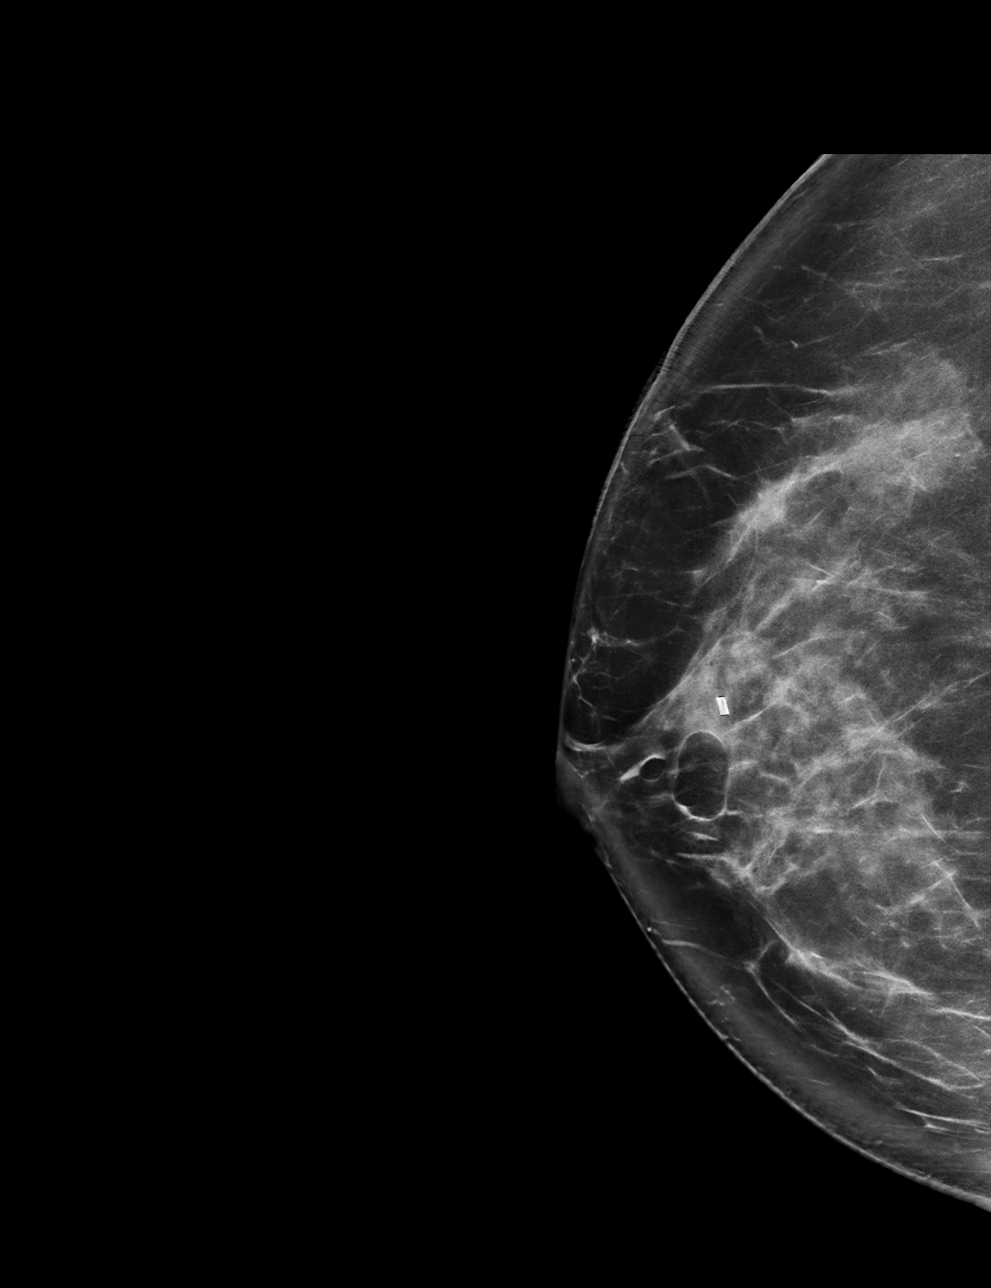

[R ML synth-2D]
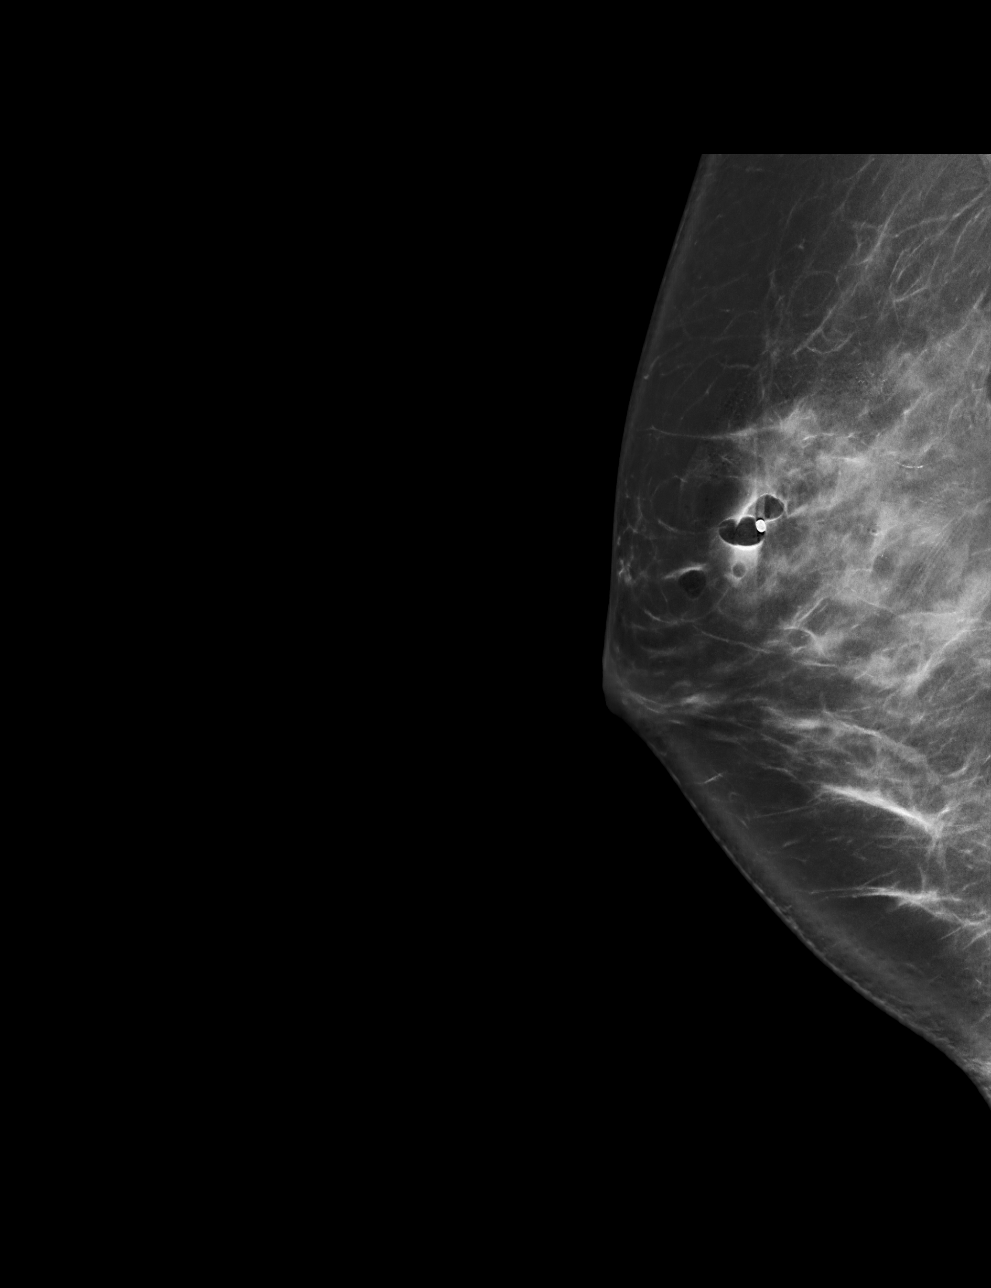

[R CC tomo · tomo slice 45/89.0]
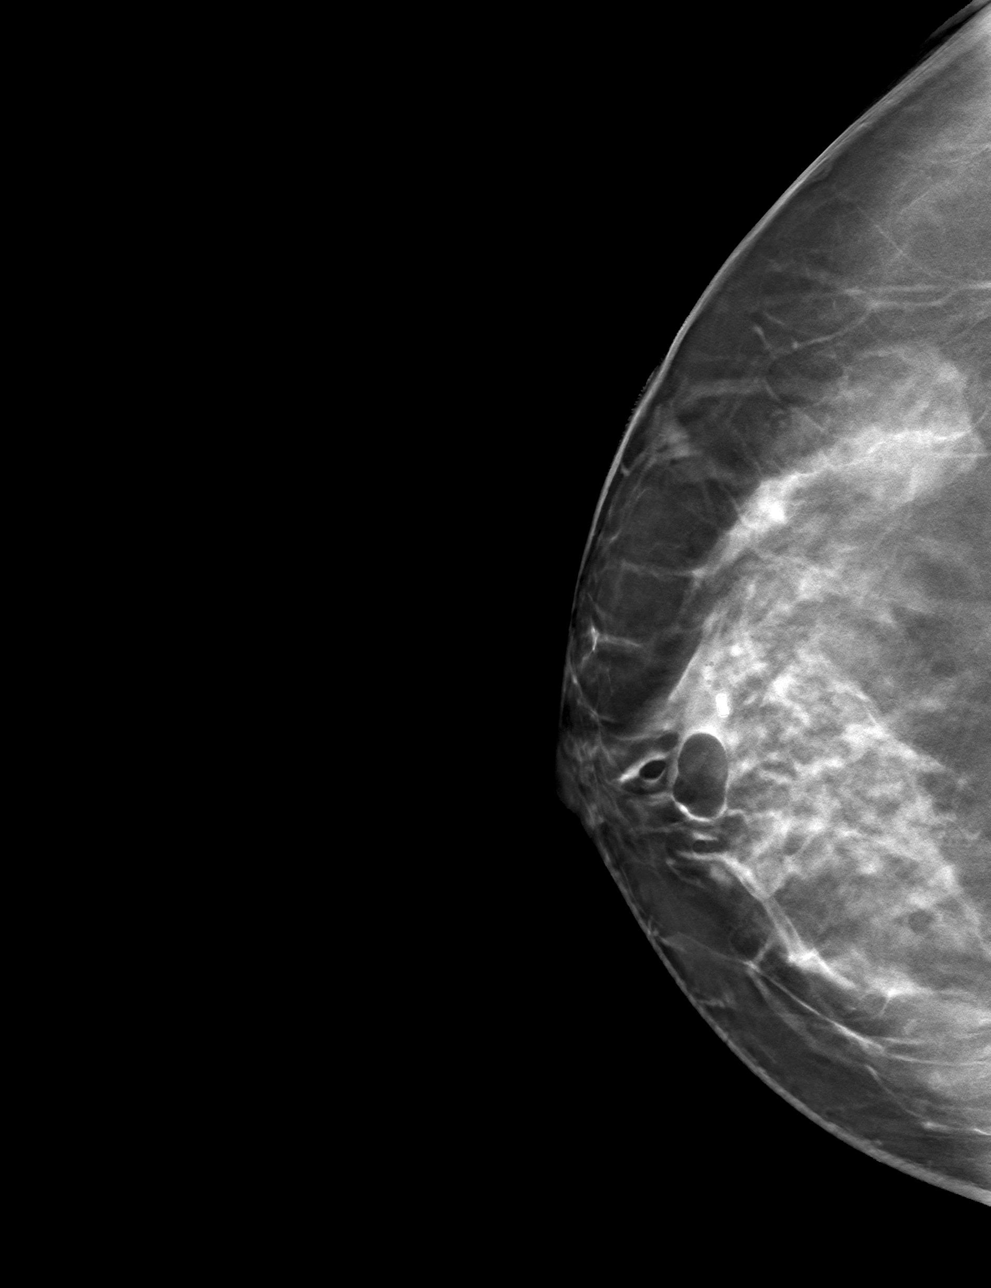

[R ML tomo · tomo slice 47/94.0]
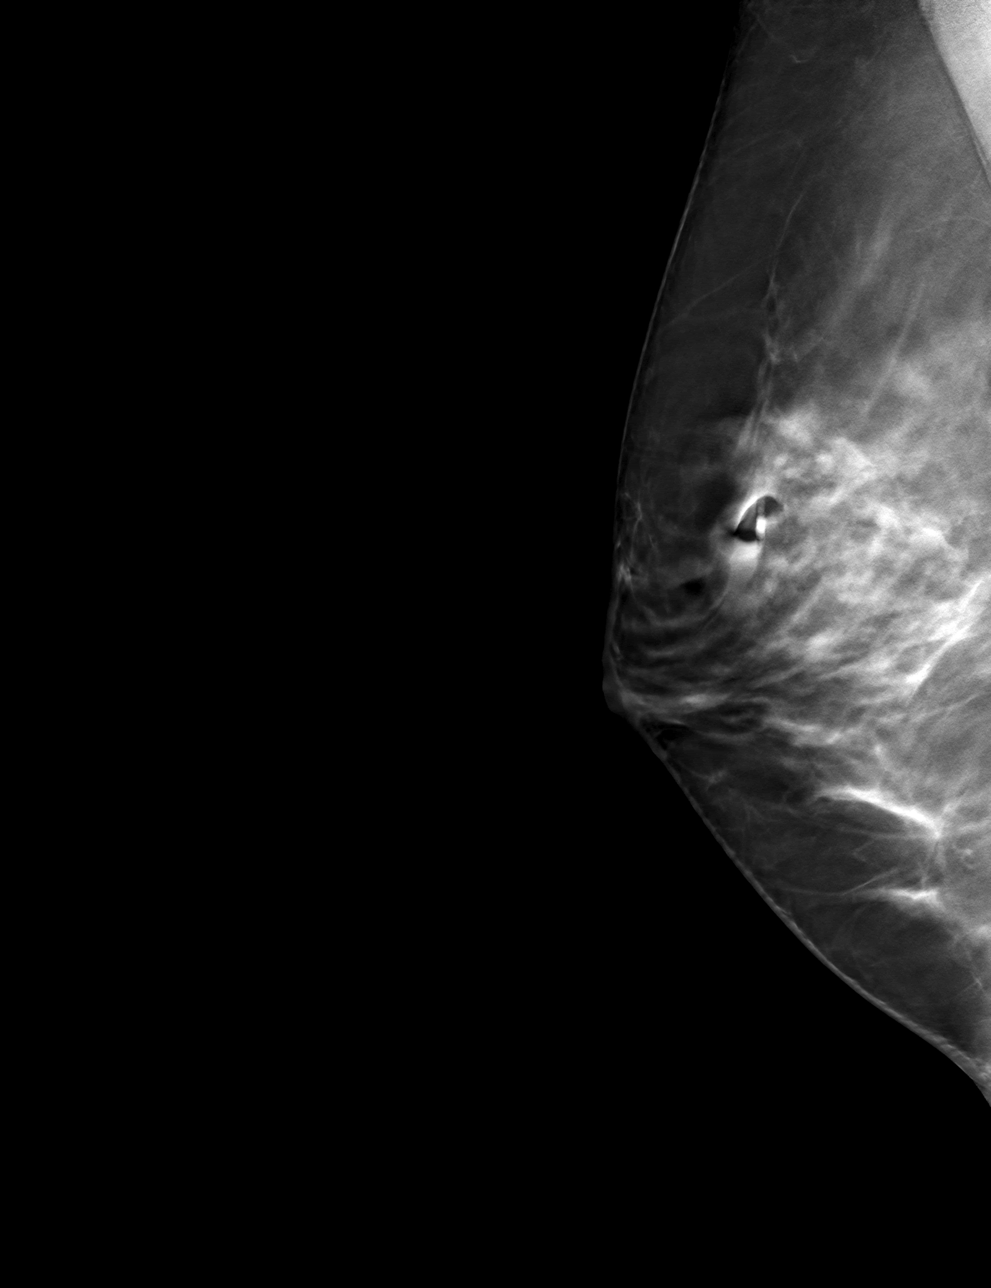

[4 of 12 positions shown; findings below may reference images not displayed]

FINDINGS: 3D Mammographic images were obtained following MRI guided biopsy of
both breast. The biopsy marking clip clips are in expected position
at the site of biopsy.
IMPRESSION: Appropriate positioning of the cylindrical shaped clip in the 12
o'clock region of the right breast and barbell shaped clip in the
posterior aspect of the left breast.

Final Assessment: Post Procedure Mammograms for Marker Placement

## 2021-02-24 IMAGING — MR MR BREAST BX W LOC DEV 1ST LESION IMAGE BX SPEC MR GUIDE*L*
9 of 14 series · 32 of 48 positions shown · IV contrast (7 ml Gadavist)
Comparison: Previous exams.
COMPARISON: Previous exams.

Addendum:
CLINICAL DATA: Recent MRI showed an enhancing mass in the 12
o'clock region of the right breast and non masslike enhancement in
the left breast. MR guided core biopsy is recommended.

EXAM:
MRI GUIDED CORE NEEDLE BIOPSY OF THE BILATERAL BREAST
TECHNIQUE: Multiplanar, multisequence MR imaging of the bilateral breast was
performed both before and after administration of intravenous
contrast.
CONTRAST:  7mL GADAVIST GADOBUTROL 1 MMOL/ML IV SOLN

[Series 2: fiducial bilateral · sagittal · 2.0mm · 1.33mm/px · 4 of 160 slices shown]
[im 1/160]
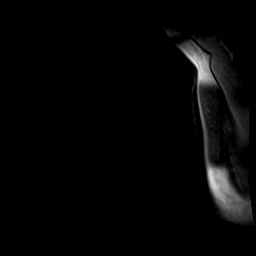
[im 54/160]
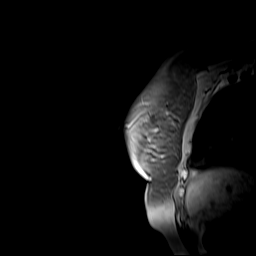
[im 107/160]
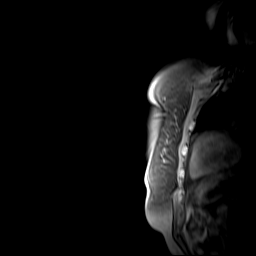
[im 160/160]
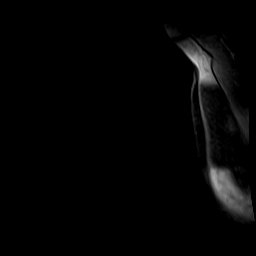

[Series 3: dynamic pre · axial · non-contrast · 1.3mm · 0.76mm/px · z∈[-123,+125]mm · 4 of 192 slices shown]
[im 1/192]
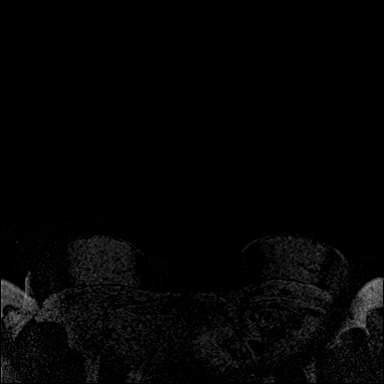
[im 64/192]
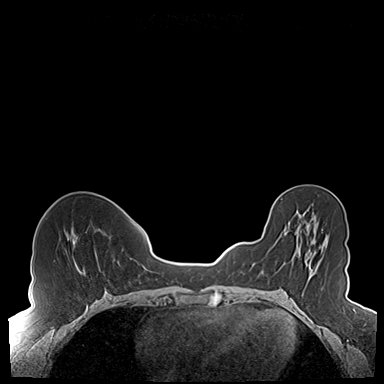
[im 128/192]
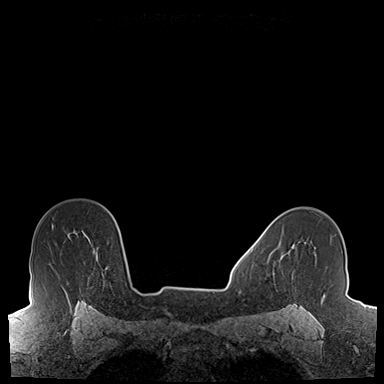
[im 192/192]
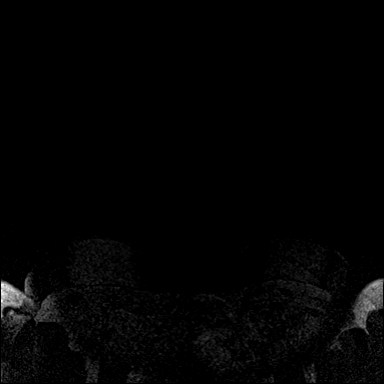

[Series 4: dynamic post 20 · axial · 1.3mm · 0.76mm/px · z∈[-123,+125]mm · 4 of 192 slices shown (1 of 2)]
[im 1/192]
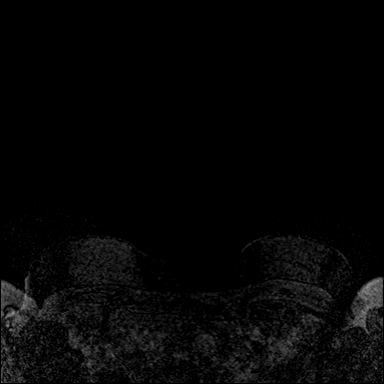
[im 64/192]
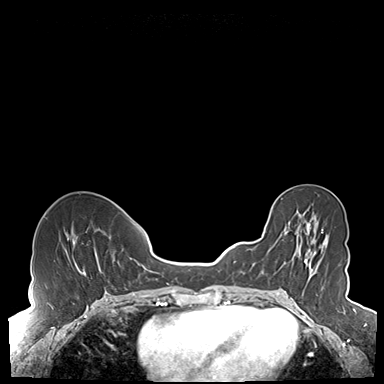
[im 128/192]
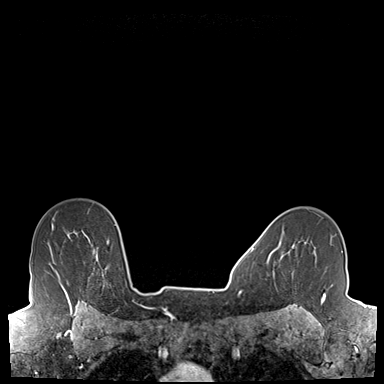
[im 192/192]
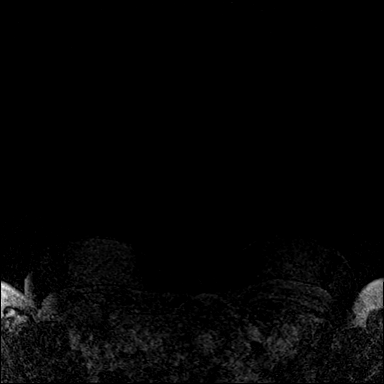

[Series 5: dynamic post 20 · axial · 1.3mm · 0.76mm/px · z∈[-123,+125]mm · 4 of 192 slices shown (2 of 2)]
[im 1/192]
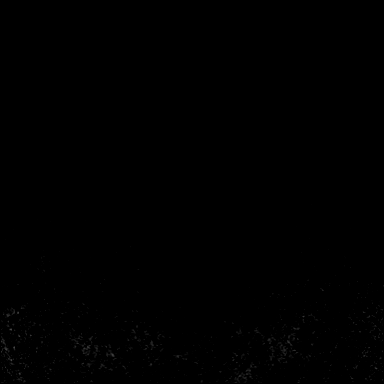
[im 64/192]
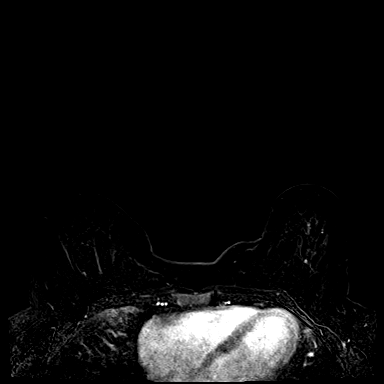
[im 128/192]
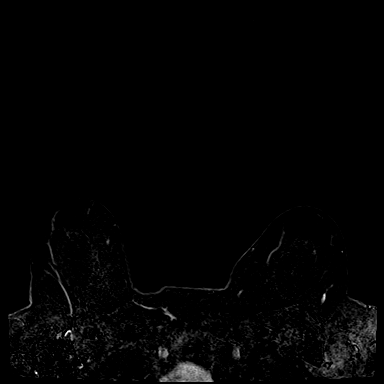
[im 192/192]
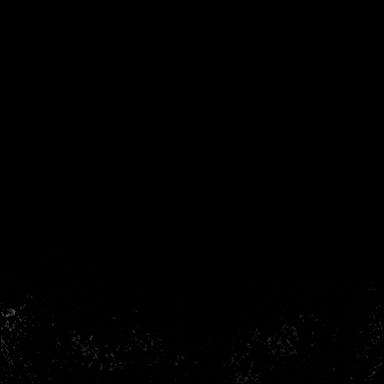

[Series 6: dynamic post 3 · axial · 1.3mm · 0.76mm/px · z∈[-123,+125]mm · 4 of 192 slices shown (1 of 2)]
[im 1/192]
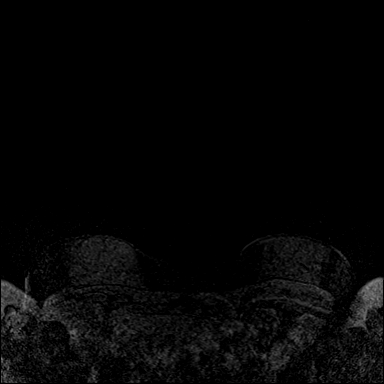
[im 64/192]
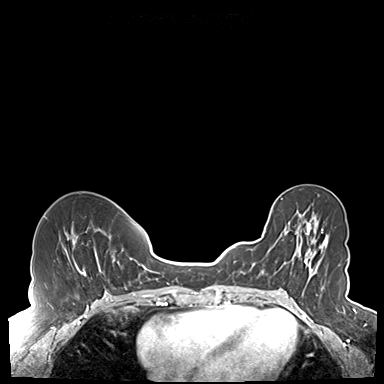
[im 128/192]
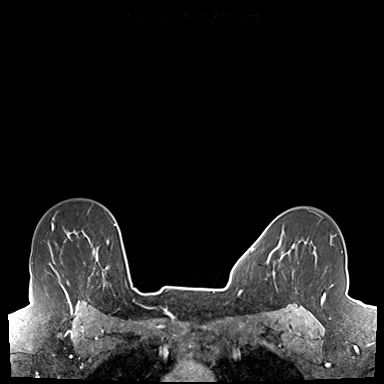
[im 192/192]
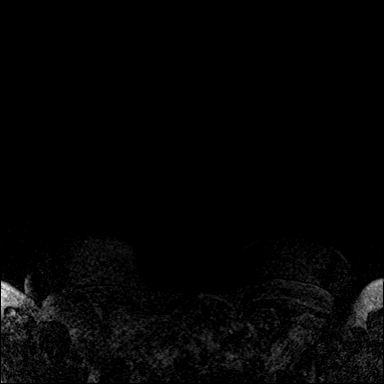

[Series 7: dynamic post 3 · axial · 1.3mm · 0.76mm/px · z∈[-123,+125]mm · 4 of 192 slices shown (2 of 2)]
[im 1/192]
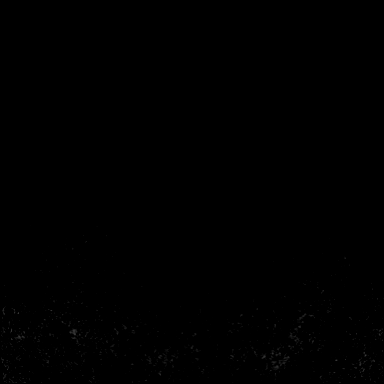
[im 64/192]
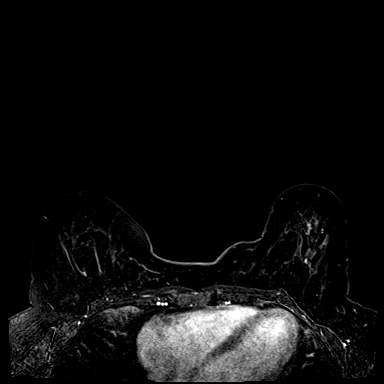
[im 128/192]
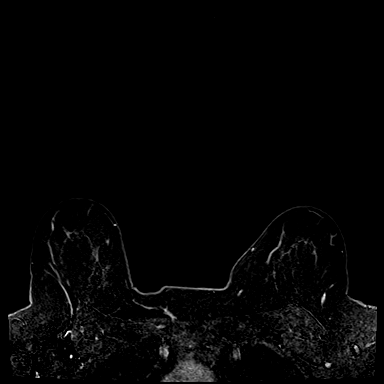
[im 192/192]
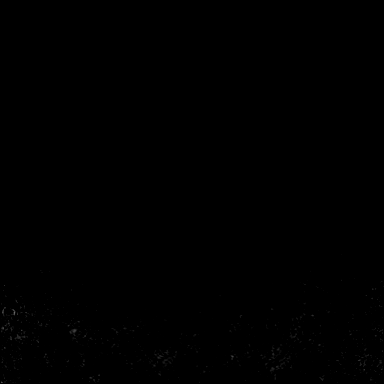

[Series 8: dynamic post 5 · axial · 1.3mm · 0.76mm/px · z∈[-123,+125]mm · 3 of 192 slices shown (1 of 2)]
[im 1/192]
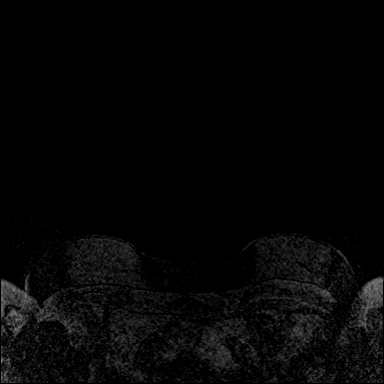
[im 96/192]
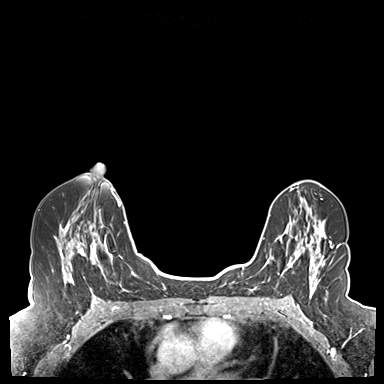
[im 192/192]
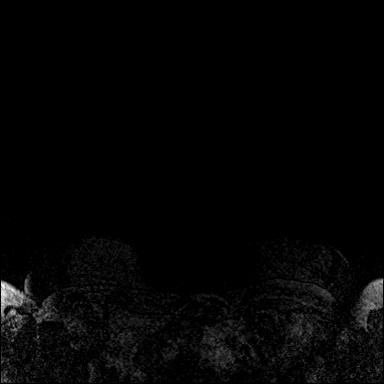

[Series 9: dynamic post 5 · axial · 1.3mm · 0.76mm/px · z∈[-123,+125]mm · 3 of 192 slices shown (2 of 2)]
[im 1/192]
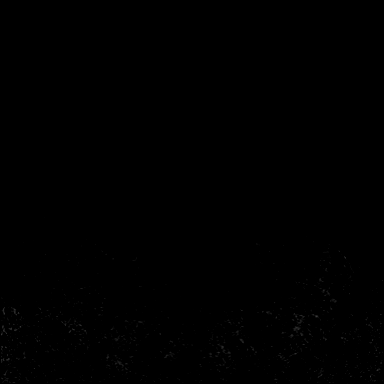
[im 96/192]
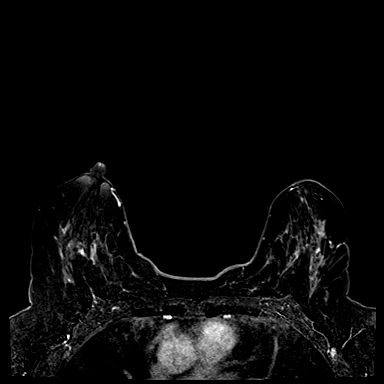
[im 192/192]
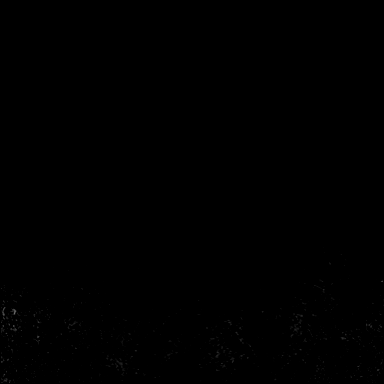

[Series 10: needle confirmation · axial · 1.3mm · 0.76mm/px · z∈[-123,+1]mm · 2 of 192 slices shown]
[im 1/192]
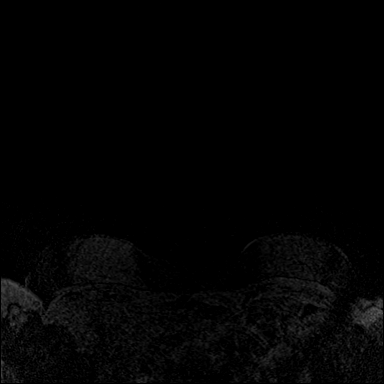
[im 96/192]
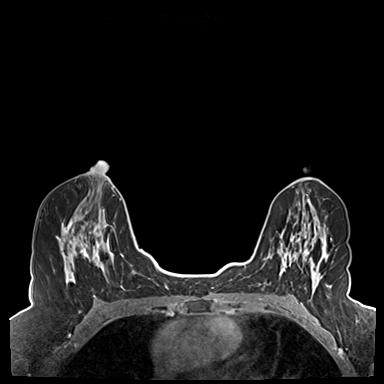

[32 of 48 positions shown; findings below may reference images not displayed]

FINDINGS: I met with the patient, and we discussed the procedure of MRI guided
biopsy, including risks, benefits, and alternatives. Specifically,
we discussed the risks of infection, bleeding, tissue injury, clip
migration, and inadequate sampling. Informed, written consent was
given. The usual time out protocol was performed immediately prior
to the procedure.

Using sterile technique, 1% Lidocaine, MRI guidance, and a 9 gauge
vacuum assisted device, biopsy was performed of A MASS IN THE 12
O'CLOCK REGION OF THE RIGHT BREAST using a lateral to medial
approach. At the conclusion of the procedure, a cylindrical shaped
tissue marker clip was deployed into the biopsy cavity. Follow-up
2-view mammogram was performed and dictated separately.

Using sterile technique, 1% Lidocaine, MRI guidance, and a 9 gauge
vacuum assisted device, biopsy was performed of NON MASSLIKE
ENHANCEMENT IN THE POSTERIOR LEFT BREAST using a lateral to medial
approach. At the conclusion of the procedure, a barbell shaped
tissue marker clip was deployed into the biopsy cavity. Follow-up
2-view mammogram was performed and dictated separately.
IMPRESSION: MRI guided biopsy of both breast.  No apparent complications.

ADDENDUM:
Pathology revealed LOBULAR NEOPLASIA (ATYPICAL LOBULAR HYPERPLASIA),
FIBROCYSTIC CHANGE AND USUAL DUCTAL HYPERPLASIA, PSEUDOANGIOMATOUS
STROMAL HYPERPLASIA (PASH) of the RIGHT breast, 12:00 o'clock,
(cylinder clip). This was found to be concordant by Dr. DESPOSORIO,
with excision recommended.

Pathology revealed FIBROCYSTIC CHANGE WITH APOCRINE METAPLASIA of
the LEFT breast, medial central, (barbell clip). This was found to
be concordant by Dr. DESPOSORIO.

Pathology results were discussed with the patient by telephone by
DESPOSORIO, Bilingual patient Services Representative. The
patient reported doing well after the biopsies with tenderness at
the sites. Post biopsy instructions and care were reviewed and
questions were answered. The patient was encouraged to call The

Pathology results and recommendations were sent to DESPOSORIO,
via [REDACTED] message on [DATE].

DESPOSORIO arrange a surgical referral and contact the
patient.

Pathology results reported by DESPOSORIO, RN on [DATE].

*** End of Addendum ***
FINDINGS: I met with the patient, and we discussed the procedure of MRI guided
biopsy, including risks, benefits, and alternatives. Specifically,
we discussed the risks of infection, bleeding, tissue injury, clip
migration, and inadequate sampling. Informed, written consent was
given. The usual time out protocol was performed immediately prior
to the procedure.

Using sterile technique, 1% Lidocaine, MRI guidance, and a 9 gauge
vacuum assisted device, biopsy was performed of A MASS IN THE 12
O'CLOCK REGION OF THE RIGHT BREAST using a lateral to medial
approach. At the conclusion of the procedure, a cylindrical shaped
tissue marker clip was deployed into the biopsy cavity. Follow-up
2-view mammogram was performed and dictated separately.

Using sterile technique, 1% Lidocaine, MRI guidance, and a 9 gauge
vacuum assisted device, biopsy was performed of NON MASSLIKE
ENHANCEMENT IN THE POSTERIOR LEFT BREAST using a lateral to medial
approach. At the conclusion of the procedure, a barbell shaped
tissue marker clip was deployed into the biopsy cavity. Follow-up
2-view mammogram was performed and dictated separately.
IMPRESSION: MRI guided biopsy of both breast.  No apparent complications.

## 2021-02-24 IMAGING — MG MM BREAST LOCALIZATION CLIP
4 series · 4 of 12 positions shown · non-contrast
Comparison: Previous exam(s).

CLINICAL DATA: Status post MR guided core biopsies of both breast.

EXAM:
3D DIAGNOSTIC BILATERAL MAMMOGRAM POST MRI BIOPSIES

[L CC synth-2D]
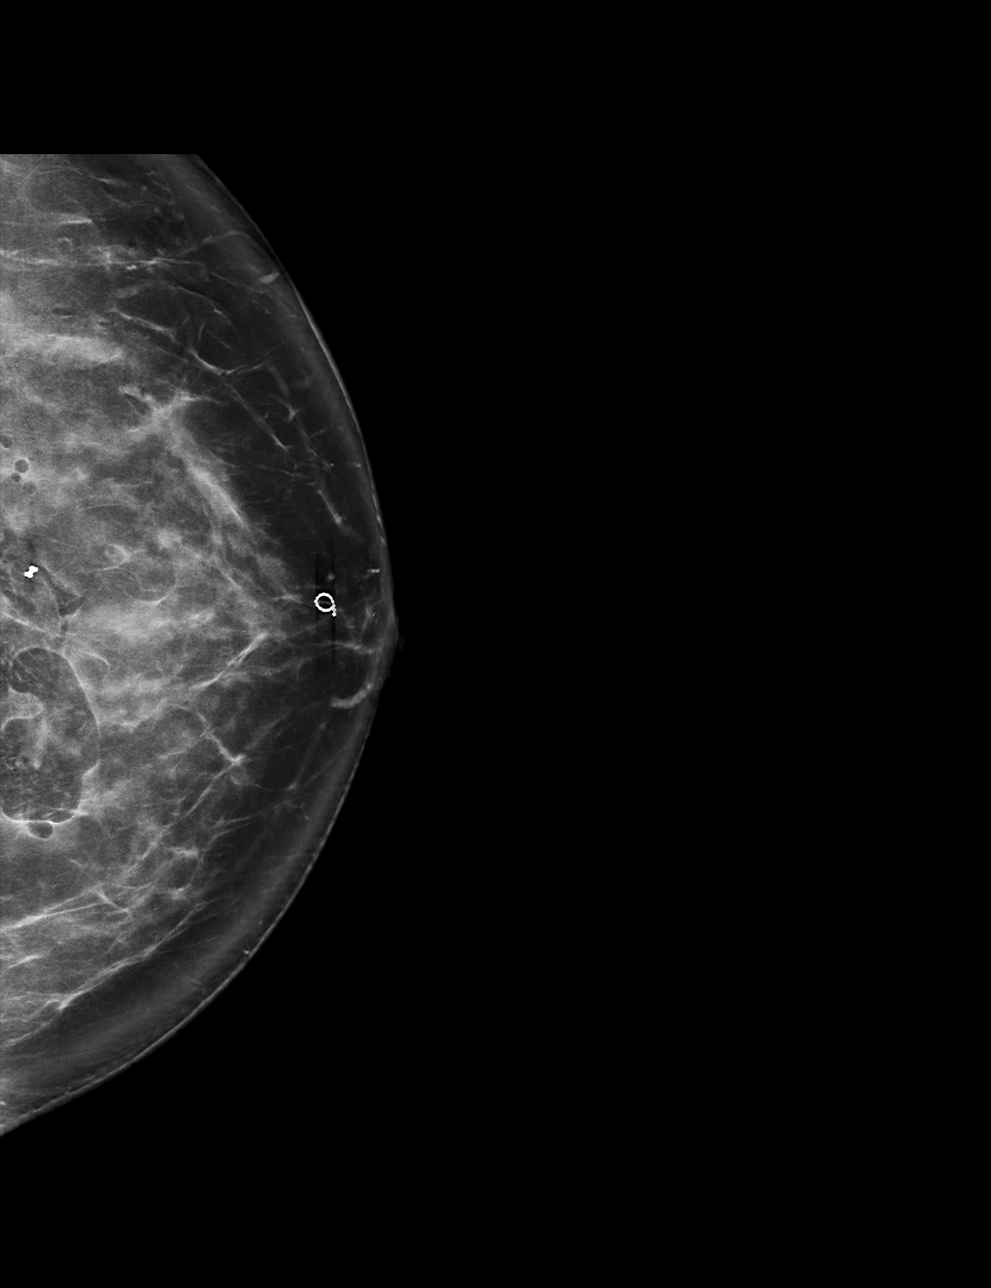

[L ML synth-2D]
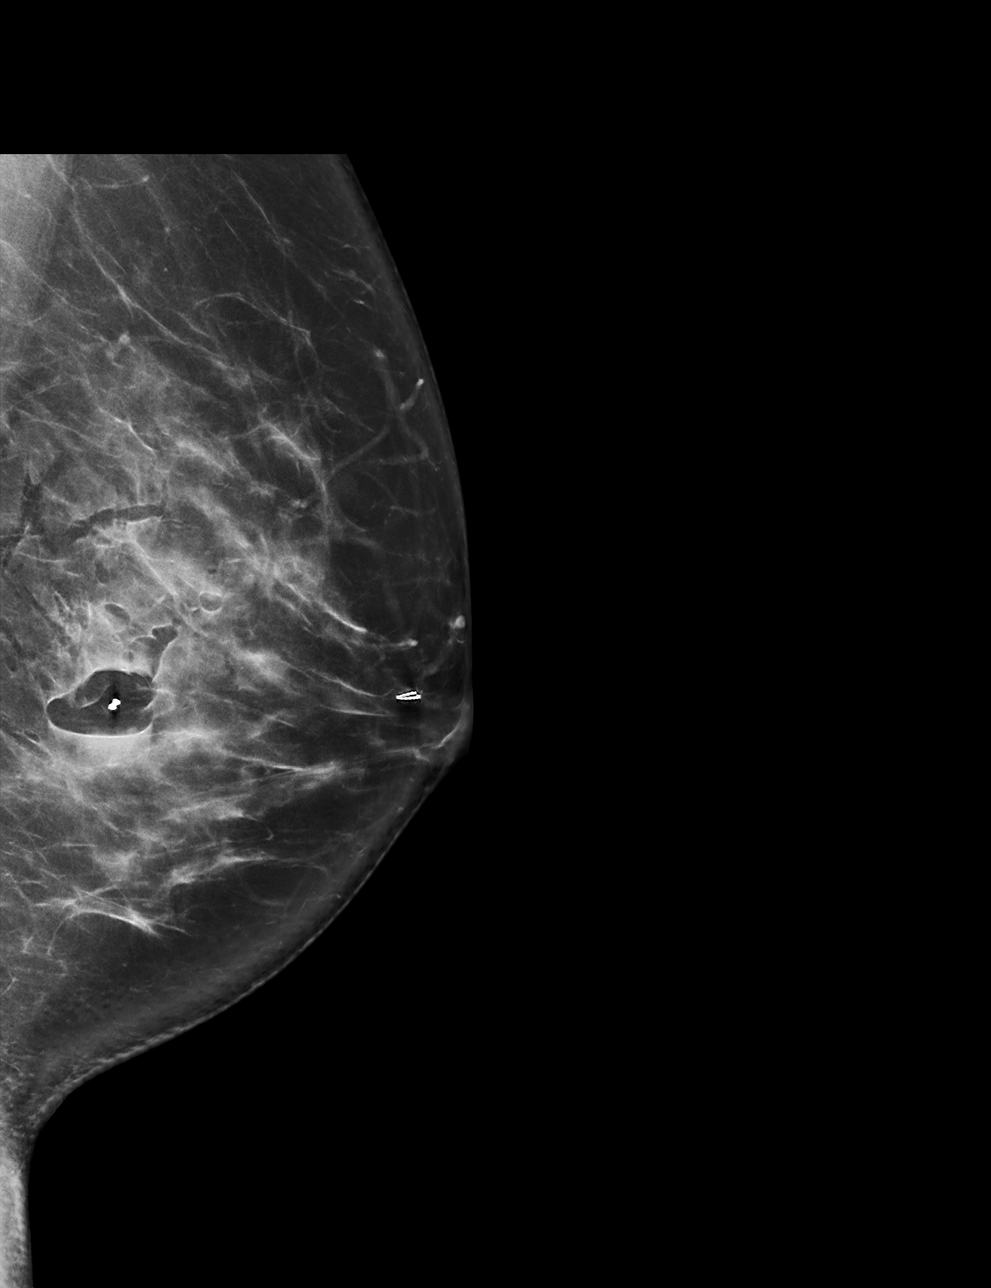

[L ML tomo · tomo slice 47/92.0]
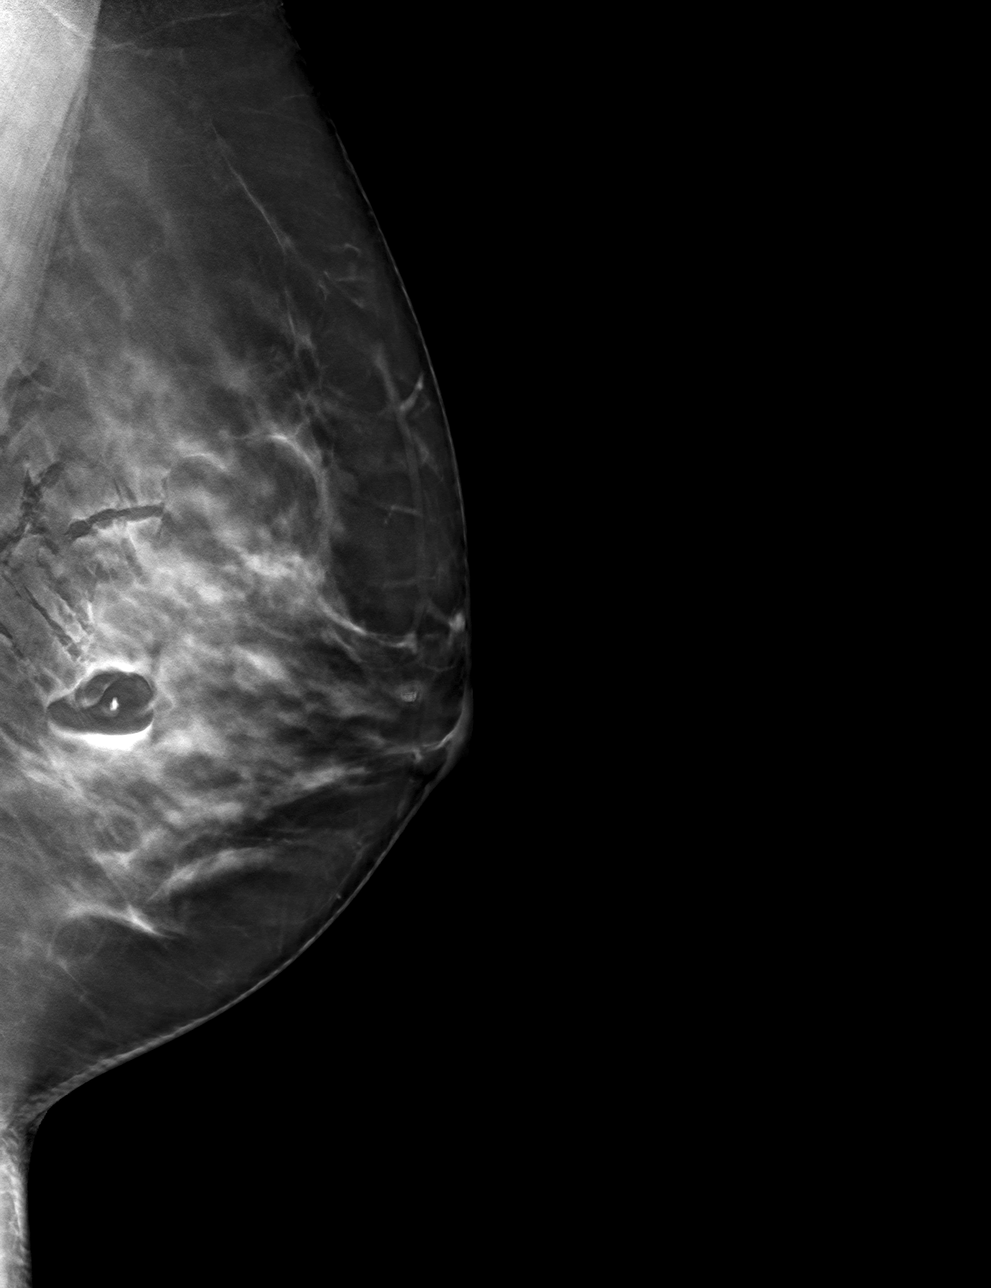

[L CC tomo · tomo slice 45/90.0]
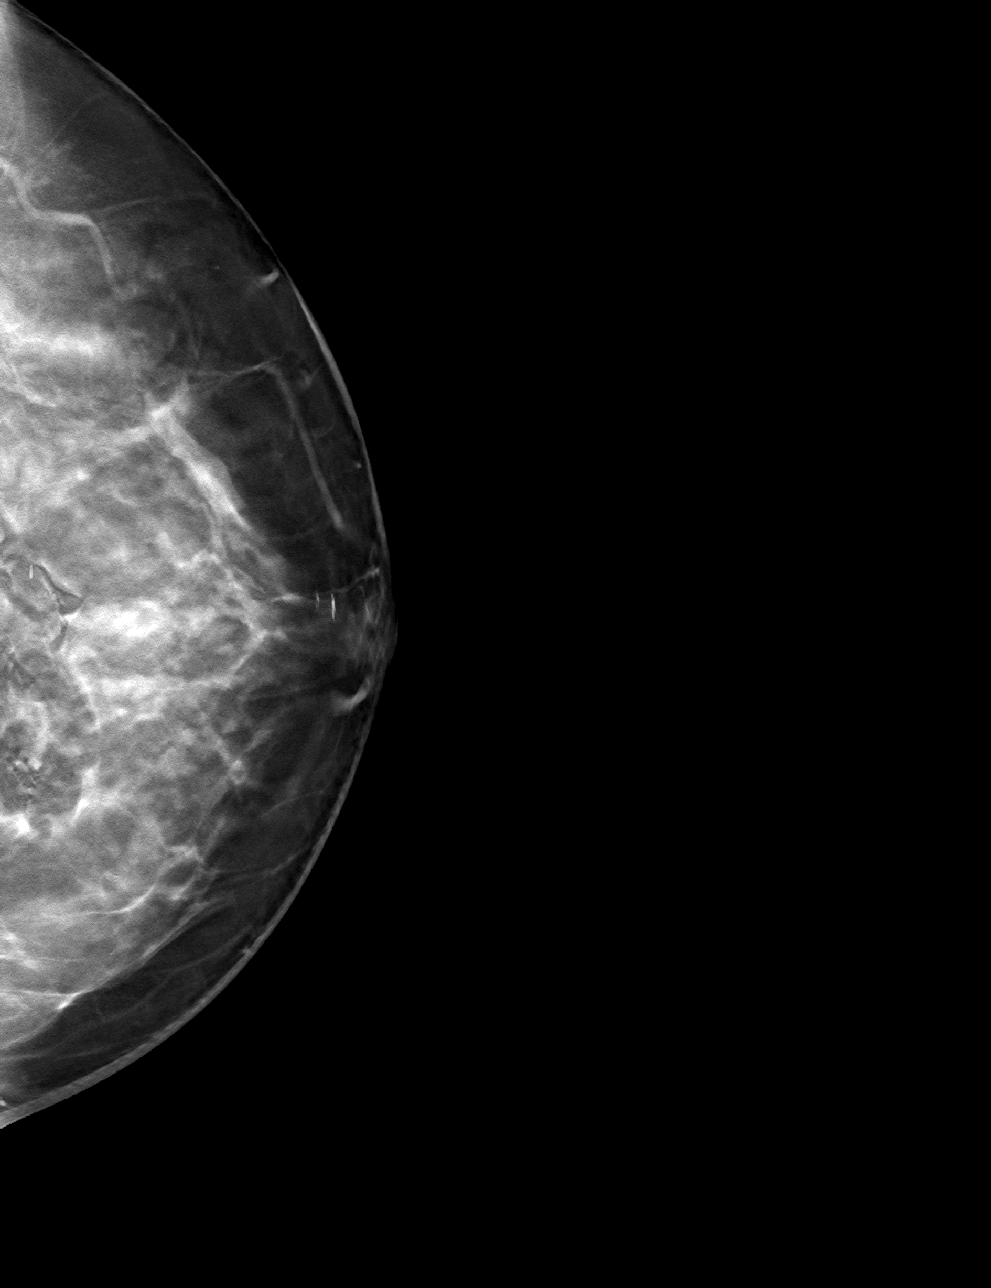

[4 of 12 positions shown; findings below may reference images not displayed]

FINDINGS: 3D Mammographic images were obtained following MRI guided biopsy of
both breast. The biopsy marking clip clips are in expected position
at the site of biopsy.
IMPRESSION: Appropriate positioning of the cylindrical shaped clip in the 12
o'clock region of the right breast and barbell shaped clip in the
posterior aspect of the left breast.

Final Assessment: Post Procedure Mammograms for Marker Placement

## 2021-02-24 MED ORDER — GADOBUTROL 1 MMOL/ML IV SOLN
7.0000 mL | Freq: Once | INTRAVENOUS | Status: AC | PRN
  Administered 2021-02-24: 7 mL via INTRAVENOUS

## 2021-02-26 ENCOUNTER — Encounter: Payer: Self-pay | Admitting: *Deleted

## 2021-02-26 NOTE — Progress Notes (Signed)
Received message from Cira Rue, RN at [facility] that patient had been notified of her benign biopsy results, but need for surgical consult.  I called patient via Claretha Cooper, the interpreter and informed her that [facility] will assist with scheduling her surgical consultation for the atypical lobular hyperplasia.  Appointment scheduled to see Dr. Aleen Campi on 03/05/21 @ 9:30.  She is to arrive at 9:15, take a photo ID, meds and wear a mask.

## 2021-03-05 ENCOUNTER — Ambulatory Visit (INDEPENDENT_AMBULATORY_CARE_PROVIDER_SITE_OTHER): Payer: Self-pay | Admitting: Surgery

## 2021-03-05 ENCOUNTER — Other Ambulatory Visit: Payer: Self-pay

## 2021-03-05 ENCOUNTER — Encounter: Payer: Self-pay | Admitting: Surgery

## 2021-03-05 VITALS — BP 108/74 | HR 76 | Temp 98.6°F | Ht 62.0 in | Wt 156.6 lb

## 2021-03-05 DIAGNOSIS — N6091 Unspecified benign mammary dysplasia of right breast: Secondary | ICD-10-CM

## 2021-03-05 NOTE — Patient Instructions (Addendum)
Please complete the Financial Assistance paperwork and follow instructions on where to mail or hand deliver to [facility] billing department.   When you hear from the Financial program call our office and we will schedule your surgery.  Please have the Pink surgery sheet available when speaking with our surgery scheduler.    Tumorectoma Lumpectomy  [employer], a veces llamada mastectoma parcial, es [place] para extirpar un tumor canceroso o bulto (el tumor) de Educational psychologist. Es una forma de ciruga para "conservar la mama" o "preservar la mama". Esto significa que seextirpa el tejido Insurance risk surveyor, pero la mama permanece intacta. Durante una tumorectoma, se extirpa la porcin de la mama que contiene el tumor. Tambin puede extirparse el tejido normal que rodea el bulto para asegurarse de que todo el tumor ha sido extirpado. Los ganglios linfticos que estn debajo del brazo tambin pueden extirparse y analizarse para Financial risk analyst si el cncer se ha propagado. Los ganglios linfticos forman parte del sistema del cuerpo que combate las enfermedades (sistema inmunitario) y, por lo general, es Estate agent al que se propaga el cncer de mama. Informe al mdico acerca de lo siguiente: Cualquier alergia que tenga. Todos los [employer] [place], incluidos vitaminas, hierbas, gotas oftlmicas, cremas y [address] de [address]. Cualquier problema previo que usted o algn miembro de su familia hayan tenido con los anestsicos. Cualquier trastorno de la sangre que tenga. Cirugas a las que se haya sometido. Cualquier afeccin mdica que tenga. Si est embarazada o podra estarlo. Cules son los riesgos? En general, se trata de un procedimiento seguro. Sin embargo, pueden ocurrir complicaciones, por ejemplo: Sangrado. Infeccin. Reaccin alrgica a un medicamento. Dolor, hinchazn, debilidad o adormecimiento en el brazo del lado de la [place]. Hinchazn temporaria. Cambio en la forma de la mama,  especialmente si se extirpa una porcin grande. Tejido cicatricial que se forma en el lugar de la ciruga y que es duro al tacto. Cogulos de [place]. Qu ocurre antes del procedimiento? Mantenerse hidratada Siga las instrucciones del mdico acerca de mantenerse hidratada, las cuales pueden incluir lo siguiente: [employer] horas antes del procedimiento, puede beber lquidos transparentes, como agua, jugos de fruta sin pulpa, caf negro y t solo.  Restricciones en las comidas y bebidas Siga las instrucciones del mdico respecto de las restricciones de comidas o bebidas, las cuales pueden incluir lo siguiente: Ocho horas antes del procedimiento, deje de ingerir comidas o alimentos pesados, como carne, alimentos fritos o alimentos grasos. Seis horas antes del procedimiento, deje de ingerir comidas o alimentos livianos, como tostadas o cereales. Seis horas antes del procedimiento, deje de beber [place] o bebidas que [employer]. Dos horas antes del procedimiento, deje de beber lquidos transparentes. Medicamentos Consulte al mdico sobre: Multimedia programmer o suspender los medicamentos que [place] habitualmente. Esto es muy importante si toma medicamentos para la diabetes o anticoagulantes. Tomar medicamentos como aspirina e ibuprofeno. Estos medicamentos pueden tener un efecto anticoagulante en la [place]. No tome estos medicamentos a menos que el mdico se lo indique. Usar medicamentos de venta libre, vitaminas, hierbas y suplementos. Instrucciones generales Antes de la [place], el mdico puede realizar un procedimiento para [place] y Cabin crew la zona del tumor en la mama (localizacin). Esto ayuda a Data processing manager al [employer] lugar donde se har la incisin. Esto se Scientist, product/process development de las siguientes maneras: Pruebas de diagnstico por imgenes, como una [employer], ecografa o una resonancia magntica (RM). La colocacin de un implante pequeo en forma de alambre, clip o semilla que  refleje la seal de Product/process development scientist. Es posible que le realicen estudios o exmenes de deteccin para obtener mediciones iniciales del brazo. Esas mediciones pueden compararse con las realizadas despus de la ciruga para controlar si hay hinchazn (linfedema), que puede aparecer despus de que se extirpan los ganglios linfticos. Pregntele al mdico: Cmo se Forensic psychologist de la Leisure centre manager. Qu medidas se tomarn para evitar una infeccin. Estas medidas pueden incluir: Lavar la piel con un jabn antisptico. Recibir antibiticos. Haga que alguien la lleve a su casa desde el hospital o la clnica. Planifique para que un adulto responsable la cuide durante al menos 24 horas despus de que le den el alta del hospital o de la [place]. Esto es importante. Qu ocurre durante el procedimiento?  Le colocarn una va intravenosa en una vena. Le administrarn uno o ms de los siguientes medicamentos: Un medicamento para ayudar a Lexicographer (sedante). Un medicamento para adormecer la zona (anestesia local). Un medicamento que la har dormir (anestesia general). El mdico usar un bistur elctrico que emplea calor para reducir Careers information officer (Engineer, production). Se realizar una incisin curva que sigue la forma normal de la [place]. Este tipo de incisin permitir que las cicatrices sean mnimas y que la cicatrizacin sea mejor. El tumor se extirpar junto con parte del tejido circundante. Luego se enviar a un laboratorio para su anlisis. Adems, el mdico puede extirpar los ganglios linfticos en este momento, si es necesario. Si el tumor est cerca de los msculos del pecho, tambin puede extirparse Neomia Dear parte del tejido muscular. Pueden colocarle un drenaje en la zona de la mama o axila para recolectar el lquido que se acumule despus de la [place]. El drenaje se conectar con una pera de goma de succin fuera del cuerpo para extraer el lquido. La incisin se cerrar con puntos (suturas). Se puede colocar una venda  (vendaje) sobre la incisin. El procedimiento puede variar segn el mdico y el hospital. Ladell Heads ocurre despus del procedimiento? Le controlarn la presin arterial, la frecuencia cardaca, la frecuencia respiratoria y Air cabin crew de oxgeno en la sangre hasta que le den el alta del hospital o la clnica. Le darn medicamentos para Engineer, materials si los necesita. Le quitarn la va intravenosa cuando pueda comer y beber. Le recomendarn que se levante y camine lo antes posible. Esto es importante para mejorar el flujo sanguneo y la respiracin. Pida ayuda si se siente dbil o inestable. Es posible que le realicen: Una sonda de drenaje durante 2 o 3 das para evitar la acumulacin de sangre (hematoma) en la mama. Le darn instrucciones para el cuidado del drenaje antes de regresar a su casa. La aplicacin de un vendaje compresivo durante 1 o 2 das para evitar el sangrado o la hinchazn. El vendaje compresivo tendr la apariencia de una pieza gruesa de tela o una venda elstica. Consulte a su mdico cmo debe cuidar el vendaje en su casa. Es posible que le coloquen una manga ajustada para usar en el brazo del lado de la [place]. Debe usar Firefighter como se lo haya indicado el mdico. No conduzca durante 24 horas si le administraron un sedante durante el procedimiento. Resumen [employer], a veces llamada mastectoma parcial, es [place] para extirpar un tumor canceroso o bulto (el tumor) de Educational psychologist. Durante una tumorectoma, se extirpa la porcin de la mama que contiene el tumor. Los ganglios linfticos que estn debajo del brazo tambin pueden extirparse y Corporate treasurer para Programmer, applications  se ha propagado. Haga que alguien la lleve a su casa desde el hospital o la clnica. Pueden colocarle un drenaje en la mama durante 2 a 3 das para evitar la acumulacin de sangre (hematoma) en la mama. Le darn instrucciones para el cuidado del drenaje antes de regresar a su casa. Esta informacin no  tiene Theme park manager el consejo del mdico. Asegresede hacerle al mdico cualquier pregunta que tenga. Document Revised: 04/12/2019 Document Reviewed: 04/12/2019 Elsevier Patient Education  2022 [employer].

## 2021-03-06 NOTE — Progress Notes (Signed)
03/06/2021  Reason for Visit:  right breast [facility]  Referring Provider:  Molli Posey, RN  History of Present Illness: Sally Ellis is a 45 y.o. female presenting for evaluation of bilateral nipple discharge.  The patient reports that she's had this for about a year.  Mostly, the drainage is non-spontaneous, and she may notice it when she pushes on her breast or when she's showering.  Denies any significant breast pain.  She had a bilateral diagnostic mammogram on 10/23/20 which showed a potential left breast retroareolar mass.  This was biopsied on 11/12/20, which showed benign breast tissue with fibrocystic changes and apocrine metaplasia.  Given that her drainage was bilateral, she then had a bilateral breast MRI on 02/12/21 which showed bilateral breast masses.  On the right side, there was a retroareolar 12 o'clock mass measuring 1.3 x 1.1 cm.  On the left, there was a 1.2 x 1.2 cm area of non-mass enhancement.  Both were biopsied on 02/24/21.  The left biopsy showed fibrocystic changes with apocrine metaplasia, and on the right, it showed atypical lobular hyperplasia, PASH, and fibrocystic changes.  She was referred to [place] for evaluation of the [facility] on the right breast.  The patient currently reports bilateral breast pain.  She reports ecchymosis on both sides, with right being worse than left.  Reports continued non-spontaneous drainage.  Denies any family or personal history of breast cancer.  Has not had any prior breast biopsies.  She was 14 years at start of menarche, and feels that she is perimenopausal.  Past Medical History: History reviewed. No pertinent past medical history.   Past Surgical History: Past Surgical History:  Procedure Laterality Date   BREAST BIOPSY Left 11/12/2020   q shape marker, path pending    Home Medications: Prior to Admission medications   Not on File    Allergies: Not on File  Social History:  reports that she has never smoked. She has never  used smokeless tobacco. No history on file for alcohol use and drug use.   Family History: Family History  Problem Relation Age of Onset   Heart disease Mother    Heart disease Father     Review of Systems: Review of Systems  Constitutional:  Negative for chills and fever.  HENT:  Negative for hearing loss.   Respiratory:  Negative for shortness of breath.   Cardiovascular:  Negative for chest pain.  Gastrointestinal:  Negative for abdominal pain, nausea and vomiting.  Genitourinary:  Negative for dysuria.  Musculoskeletal:  Negative for myalgias.  Skin:        Bilateral nipple discharge.  Bilateral ecchymosis after biopsy  Neurological:  Negative for dizziness.  Psychiatric/Behavioral:  Negative for depression.    Physical Exam BP 108/74   Pulse 76   Temp 98.6 F (37 C) (Oral)   Ht 5\' 2"  (1.575 m)   Wt 156 lb 9.6 oz (71 kg)   SpO2 97%   BMI 28.64 kg/m  CONSTITUTIONAL: No acute distress, well nourished HEENT:  Normocephalic, atraumatic, extraocular motion intact. NECK: Trachea is midline, and there is no jugular venous distension.  RESPIRATORY:  Lungs are clear, and breath sounds are equal bilaterally. Normal respiratory effort without pathologic use of accessory muscles. CARDIOVASCULAR: Heart is regular without murmurs, gallops, or rubs. BREAST:  Left breast s/p biopsy with mild ecchymosis lateral to the areola at the biopsy entry site.  No palpable masses, other skin changes.  There is dark, possibly bloody drainage from nipple when manually  pressing.  No left axillary lymphadenopathy.  Right breast s/p biopsy with more significant ecchymosis associated with the biopsy.  No palpable masses, other skin changes.  Nipple discharge is present when manually pressing, and discharge is clear fluid.  No right axillary lymphadenopathy. GI: The abdomen is soft, non-distended, non-tender.  MUSCULOSKELETAL:  Normal muscle strength and tone in all four extremities.  No peripheral edema  or cyanosis. SKIN: Skin turgor is normal. There are no pathologic skin lesions.  NEUROLOGIC:  Motor and sensation is grossly normal.  Cranial nerves are grossly intact. PSYCH:  Alert and oriented to person, place and time. Affect is normal.  Laboratory Analysis: Left breast biopsy 11/12/20: DIAGNOSIS:  A. LEFT BREAST, RETROAREOLAR; ULTRASOUND-GUIDED BIOPSY:  - BENIGN BREAST TISSUE WITH FIBROCYSTIC CHANGES AND EXTENSIVE PAPILLARY APOCRINE METAPLASIA.  - NEGATIVE FOR ATYPIA AND MALIGNANCY.  Left breast biopsy 02/24/21: Breast, left, needle core biopsy, Medial/ central - FIBROCYSTIC CHANGE WITH APOCRINE METAPLASIA. - NO MALIGNANCY IDENTIFIED.  Right breast biopsy 02/24/21: Breast, right, needle core biopsy, 12 ocl cylinder clip - LOBULAR NEOPLASIA (ATYPICAL LOBULAR HYPERPLASIA). - FIBROCYSTIC CHANGE AND USUAL DUCTAL HYPERPLASIA. - PSEUDOANGIOMATOUS STROMAL HYPERPLASIA (PASH).  Imaging: Bilateral diagnostic mammogram 10/23/20: FINDINGS: Additional views including spot compression demonstrate a 5 mm oval circumscribed mass in the lower outer left breast at posterior depth.   Spot compression was obtained in the retroareolar area of both breasts. No suspicious mammographic finding is identified in these areas.   No suspicious mammographic finding is identified in the right breast to explain the patient's symptoms. No suspicious mass, microcalcification, or other finding is identified in the right breast.   Targeted left breast ultrasound was performed in the area of the mammographic mass. At 5 o'clock 2 cm from the nipple a benign simple cyst measures 7 x 4 x 5 mm. This corresponds to the mass seen on the mammogram.   Targeted bilateral retroareolar ultrasound was performed.   In the retroareolar left breast at 1 o'clock a hypoechoic intraductal mass measures 5 x 4 x 4 mm. This does not have a definite mammographic correlate. No abnormally enlarged left axillary lymph node is identified.    In the retroareolar right breast, no suspicious solid or cystic mass is identified. No findings to explain the patient's symptoms.   Targeted right breast ultrasound was performed in the area of pain by the physician. No suspicious solid or cystic mass is identified. No findings to explain the patient's symptoms.   IMPRESSION: 1. Hypoechoic intraductal mass in the left breast is suspicious for malignancy. 2. No evidence of malignancy in the right breast or findings to explain the patient's symptoms in the right breast.   MRI bilateral breast 02/12/21: IMPRESSION: 1. 1.3 x 1.2 x 1.1 cm nonspecific enhancing mass-like area in the 12 o'clock retroareolar right breast. 2. 1.2 x 1.2 x 1.1 cm nonspecific linear branching non mass enhancement slightly medial central left breast. This is suspicious for the possibility of DCIS.   Assessment and Plan: This is a 45 y.o. female with bilateral nipple discharge, with right breast biopsy showing ALH.  --Discussed with the patient the results of the three biopsies done and the results of her imaging studies.  Discussed with her the potential for further pathology on the right breast given the already present atypia.  At this point, there is no diagnosis of cancer.  Though the chance of upgrading pathology is overall low, I still think it would be better to proceed with excision on the right breast.  Nipple discharge is worse on right than left.  The left breast has undergone biopsy x 2 and both have been benign and thus I think it's reasonable to continue monitoring that side with follow up mammogram in the future. --The patient does not have insurance, and the [employer] program does not cover surgical management of this per the patient.  Will give her the [place] financial support application today.  She will complete this and submit to see if the surgery would be covered and if so, what percentage.  She will then call us back when that is completed so we can  schedule surgery.  I think since there is no diagnosis of cancer at this point, it's ok to continue with the application process and then schedule surgery.  Discussed with her that the surgical plan would involve two steps with getting an RF tag placed in the right breast first, and then proceeding to the OR for excision.  Discussed with her the surgery at length, including risks of bleeding, infection, injury to surrounding structures, post-op care and restrictions, and she's willing to proceed.  Face-to-face time spent with the patient and care providers was 80 minutes, with more than 50% of the time spent counseling, educating, and coordinating care of the patient.     Howie Ill, MD [facility]

## 2021-04-30 ENCOUNTER — Telehealth: Payer: Self-pay | Admitting: Surgery

## 2021-04-30 NOTE — Telephone Encounter (Signed)
Outgoing call is made, left message for patient to call to check on status of whether or not she wants to proceed with scheduling surgery.  Patient last saw Dr. Aleen Campi on 03/05/21, she will need follow up first.  Patient was also given [facility] financial assistance paperwork, not sure if she has applied for this or not.  [facility] will only pay for RF tag, but not the partial mastectomy.

## 2021-10-08 ENCOUNTER — Encounter: Payer: Self-pay | Admitting: Surgery

## 2021-10-08 ENCOUNTER — Other Ambulatory Visit: Payer: Self-pay

## 2021-10-08 ENCOUNTER — Ambulatory Visit: Payer: Self-pay | Admitting: Surgery

## 2021-10-08 VITALS — BP 99/68 | HR 73 | Temp 98.3°F | Ht 62.0 in | Wt 158.0 lb

## 2021-10-08 DIAGNOSIS — N6091 Unspecified benign mammary dysplasia of right breast: Secondary | ICD-10-CM

## 2021-10-08 NOTE — Patient Instructions (Addendum)
We need you to have your annual mammogram done before we can schedule you for surgery.  We will have BCCCP contact you to schedule this. We will have you follow up here after we get the results to discuss surgery.    You may use Ibuprofen or Motrin for the breast pain.   BCCCP lo llamar para programar su mamografa  le haremos un seguimiento aqu en la oficina despus de que se haga la mamografa y [employer].  lo llamaremos para programar su cita de seguimiento  puede usar ibuprofeno o motrin para Conservation officer, historic buildings

## 2021-10-08 NOTE — Progress Notes (Signed)
10/08/2021  History of Present Illness: Sally Ellis is a 46 y.o. female following up for right breast atypical lobular hyperplasia.  She was last seen on 03/05/2021 for initial visit.  At that point, the patient wanted to wait due to financial issues in the [facility] health financial assistance application was given to her.  Patient has not followed up until recently that she is in the middle of the application process and was told that she needed to have surgery first so that she would be in debt so that the approval can be processed.  The patient reports that she still having drainage from bilateral nipples with manual palpation or compression.  She reports that over the last week or so she is also noticed some discomfort in the right breast associated with the area of the biopsy site.  Denies any new palpable masses, new skin changes, or other complications.  Her last mammogram was on 10/23/2020 when she had the initial diagnostic bilateral mammogram.  Past Medical History: -Atypical lobular hyperplasia right breast  Past Surgical History: Past Surgical History:  Procedure Laterality Date   BREAST BIOPSY Left 11/12/2020   q shape marker, path pending    Home Medications: Prior to Admission medications   Not on File    Allergies: No Known Allergies  Review of Systems: Review of Systems  Constitutional:  Negative for chills and fever.  Respiratory:  Negative for cough.   Cardiovascular:  Negative for chest pain.  Gastrointestinal:  Negative for abdominal pain, nausea and vomiting.  Skin:        Right breast pain   Physical Exam BP 99/68    Pulse 73    Temp 98.3 F (36.8 C)    Ht 5\' 2"  (1.575 m)    Wt 158 lb (71.7 kg)    SpO2 99%    BMI 28.90 kg/m  CONSTITUTIONAL: No acute distress, well nourished. HEENT:  Normocephalic, atraumatic, extraocular motion intact. RESPIRATORY:  Lungs are clear, and breath sounds are equal bilaterally. Normal respiratory effort without  pathologic use of accessory muscles. CARDIOVASCULAR: Heart is regular without murmurs, gallops, or rubs. BREAST:  Right breast with mild discomfort at the 12 o'clock position near the retroareolar space with a very small palpable mass.  Otherwise no new palpable masses, skin changes,or nipple changes.  No active drainage during exam.  Left breast without any palpable masses, skin changes, nipple changes.  No right or left axillary lymphadenopathy NEUROLOGIC:  Motor and sensation is grossly normal.  Cranial nerves are grossly intact. PSYCH:  Alert and oriented to person, place and time. Affect is normal.  Labs/Imaging: None recent  Assessment and Plan: This is a 46 y.o. female with right breast atypical lobular hyperplasia  -Discussed with the patient that has been almost a year since her last mammogram and I think first we should repeat her mammogram before proceeding with any surgical intervention to make sure there are no new findings or concerns, particularly now that she is reporting some pain in the right breast. -Once we get the mammogram done, we will have her come back to see 48 in the office so we can discuss the results and plan for surgery for the right breast atypical lobular hyperplasia.  Discussed with her that this would require radiofrequency tag placement prior to surgery. - Follow-up after mammogram.  I spent 25 minutes dedicated to the care of this patient on the date of this encounter to include pre-visit review of records, face-to-face time with  the patient discussing diagnosis and management, and any post-visit coordination of care.   Howie Ill, MD  [facility]

## 2021-10-10 ENCOUNTER — Encounter: Payer: Self-pay | Admitting: Primary Care

## 2021-12-08 ENCOUNTER — Other Ambulatory Visit: Payer: Self-pay

## 2021-12-08 DIAGNOSIS — N6320 Unspecified lump in the left breast, unspecified quadrant: Secondary | ICD-10-CM

## 2021-12-08 DIAGNOSIS — Z1211 Encounter for screening for malignant neoplasm of colon: Secondary | ICD-10-CM

## 2021-12-10 ENCOUNTER — Ambulatory Visit
Admission: RE | Admit: 2021-12-10 | Discharge: 2021-12-10 | Disposition: A | Discharge status: Home / Self Care | Payer: No Typology Code available for payment source | Source: Ambulatory Visit | Attending: Obstetrics and Gynecology | Admitting: Obstetrics and Gynecology

## 2021-12-10 ENCOUNTER — Ambulatory Visit: Payer: No Typology Code available for payment source | Attending: Hematology and Oncology | Admitting: *Deleted

## 2021-12-10 ENCOUNTER — Other Ambulatory Visit: Payer: Self-pay | Admitting: Obstetrics and Gynecology

## 2021-12-10 VITALS — BP 106/66 | HR 57 | Wt 157.0 lb

## 2021-12-10 DIAGNOSIS — N6091 Unspecified benign mammary dysplasia of right breast: Secondary | ICD-10-CM | POA: Insufficient documentation

## 2021-12-10 DIAGNOSIS — N6320 Unspecified lump in the left breast, unspecified quadrant: Secondary | ICD-10-CM

## 2021-12-10 DIAGNOSIS — Z1239 Encounter for other screening for malignant neoplasm of breast: Secondary | ICD-10-CM

## 2021-12-10 DIAGNOSIS — N644 Mastodynia: Secondary | ICD-10-CM

## 2021-12-10 IMAGING — MG DIGITAL DIAGNOSTIC BILAT W/ TOMO W/ CAD
8 of 14 series · 8 of 40 positions shown · non-contrast
Comparison: Previous exam(s).

CLINICAL DATA: History of non spontaneous bilateral nipple
discharge and a benign ultrasound-guided LEFT breast biopsy
demonstrating fibrocystic change and cystic papillary apocrine
metaplasia in [DATE] (Q clip). Patient subsequently underwent
bilateral breast MRI and bilateral breast MRI guided biopsies. RIGHT
breast MRI guided biopsy demonstrated pseudoangiomatous stromal
hyperplasia and atypical lobular hyperplasia (CYLINDER clip). LEFT
breast biopsy demonstrated fibrocystic change (barbell clip).
Patient has elected for short-term follow-up.

EXAM:
DIGITAL DIAGNOSTIC BILATERAL MAMMOGRAM WITH TOMOSYNTHESIS AND CAD;
ULTRASOUND RIGHT BREAST LIMITED
TECHNIQUE: Bilateral digital diagnostic mammography and breast tomosynthesis
was performed. The images were evaluated with computer-aided
detection.; Targeted ultrasound examination of the right breast was
performed

[R MLO synth-2D (1 of 3)]
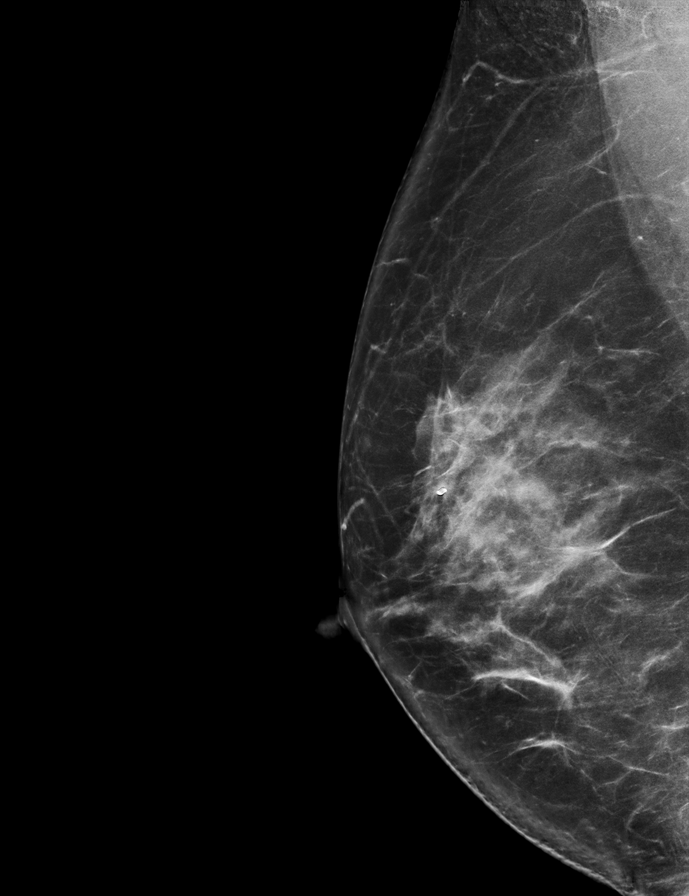

[R MLO synth-2D (2 of 3)]
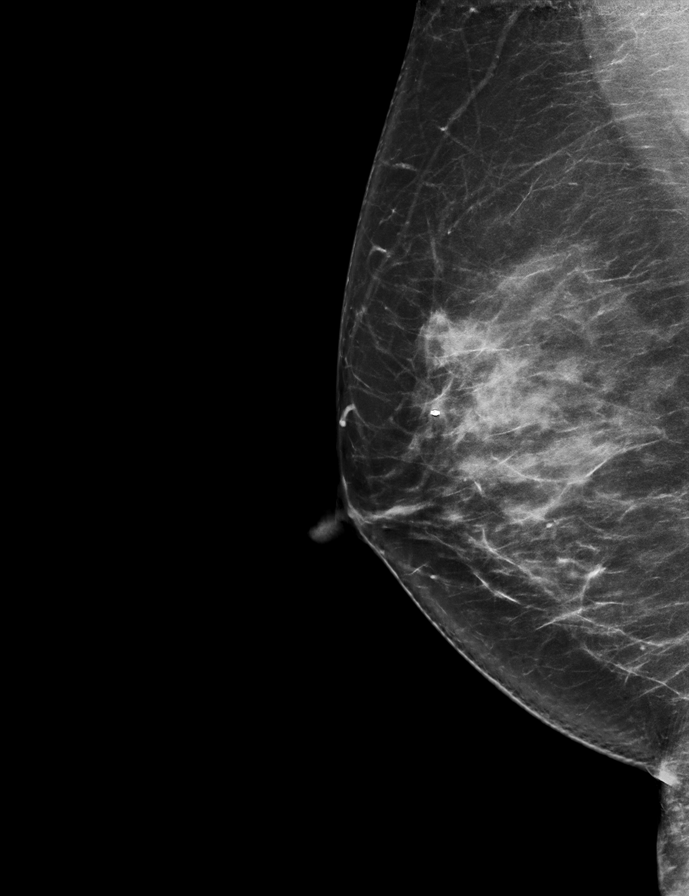

[R MLO synth-2D (3 of 3)]
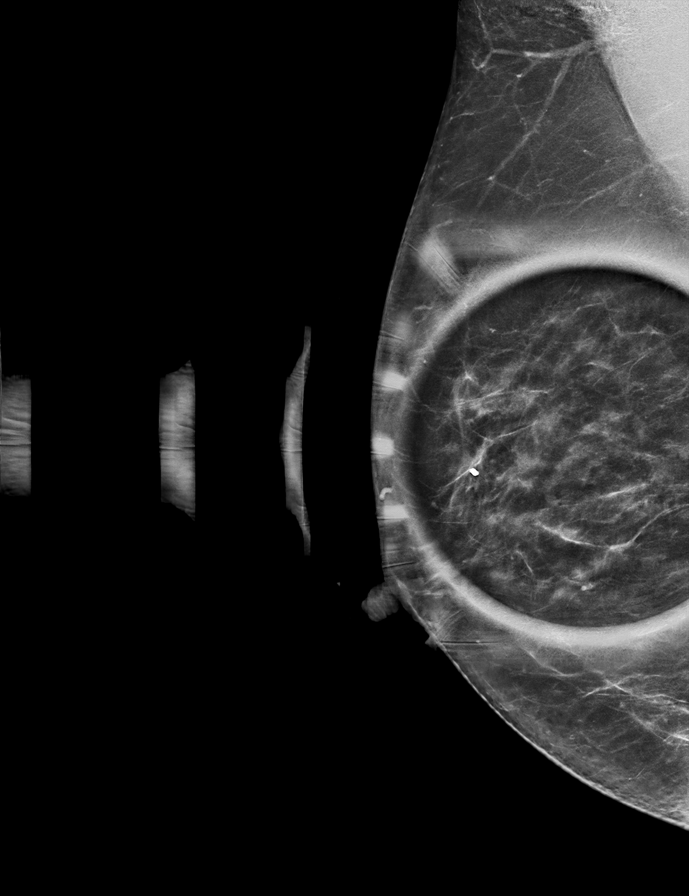

[R CC synth-2D (1 of 2)]
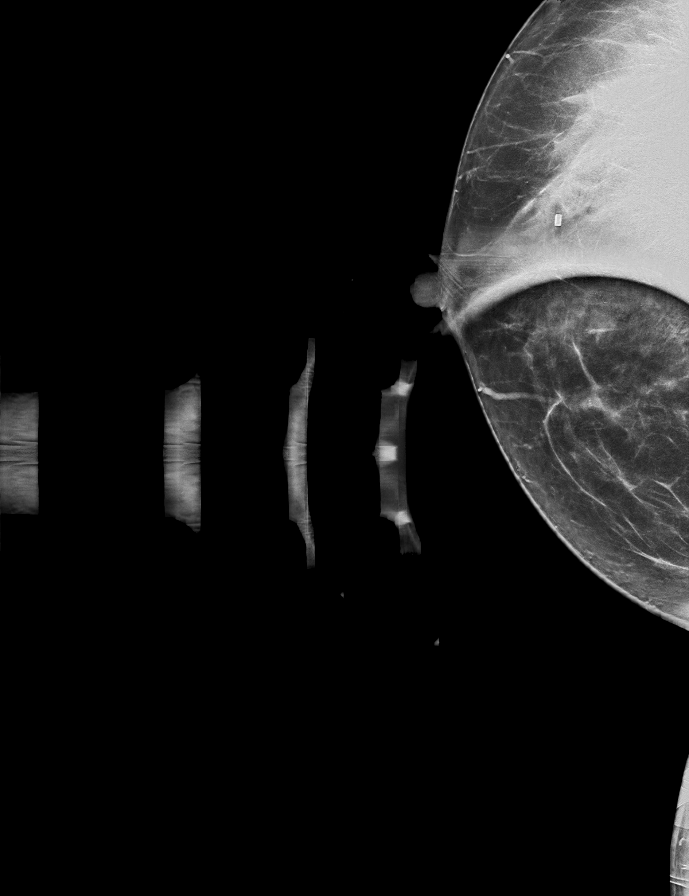

[L MLO synth-2D]
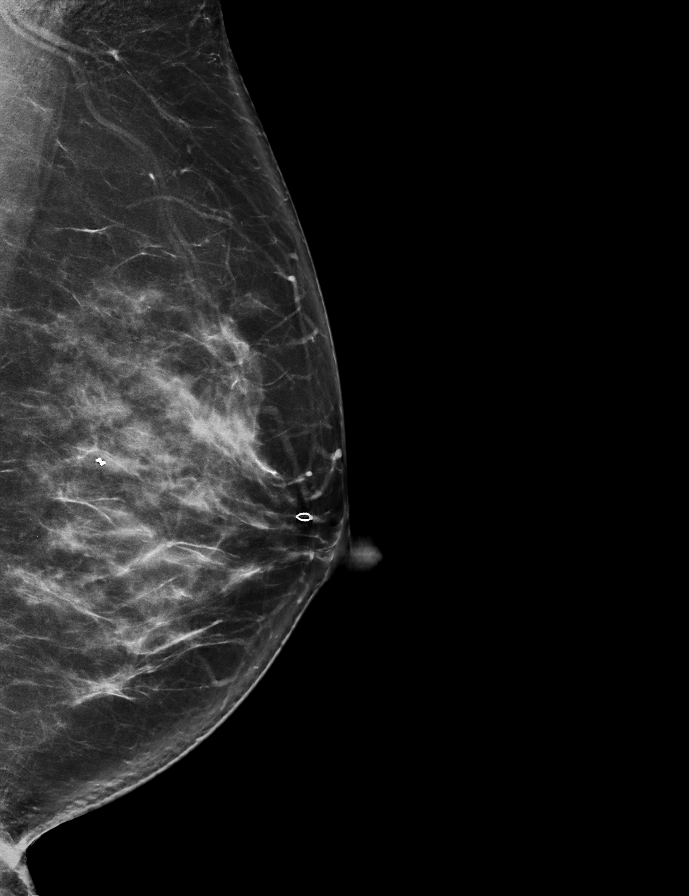

[L CC synth-2D]
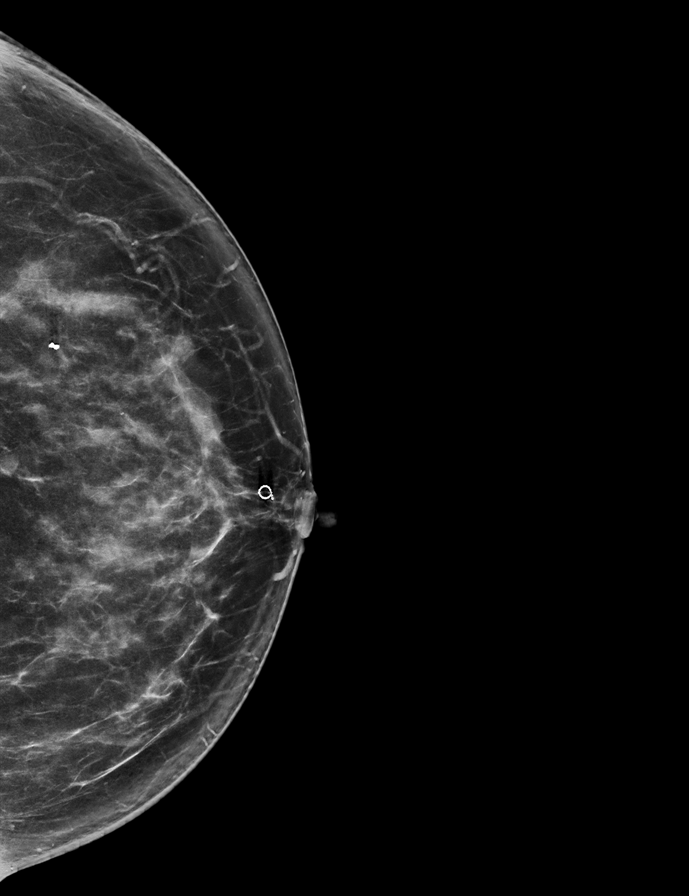

[R CC synth-2D (2 of 2)]
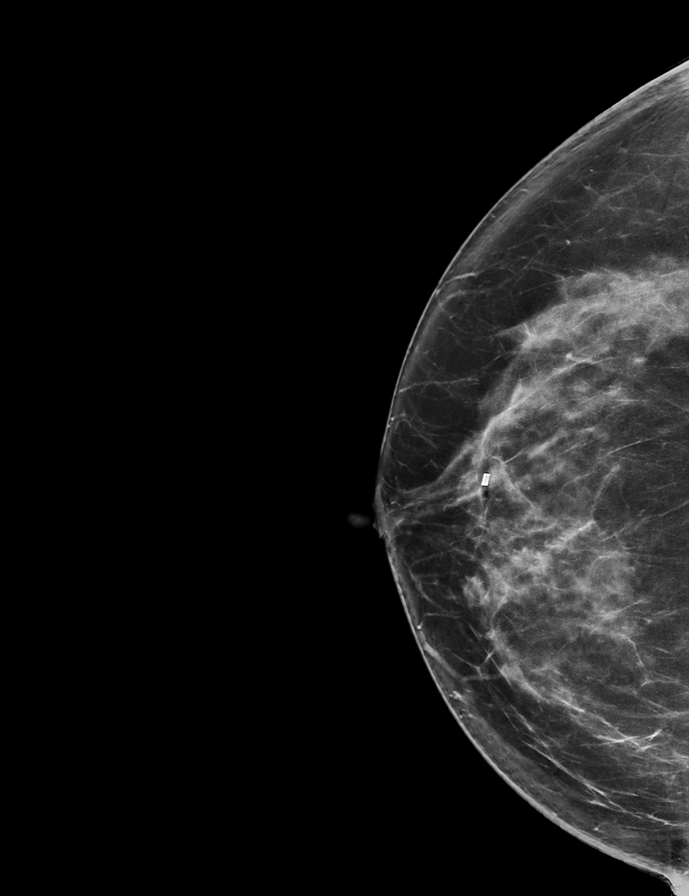

[L MLO tomo · tomo slice 43/85.0]
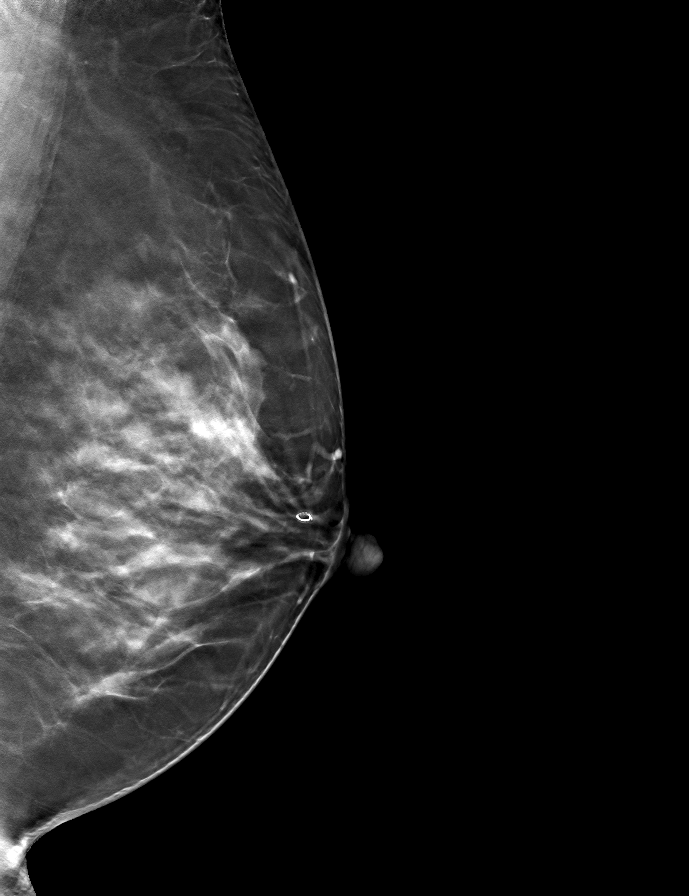

[8 of 40 positions shown; findings below may reference images not displayed]

ACR Breast Density Category c: The breast tissue is heterogeneously
dense, which may obscure small masses.
FINDINGS: Diagnostic images of the RIGHT breast demonstrate a CYLINDER shaped
clip in the RIGHT breast at site of biopsy pseudoangiomatous stromal
hyperplasia with atypical lobular hyperplasia. No adjacent
suspicious changes. Spot compression tomosynthesis views of the
RIGHT breast demonstrate an oval circumscribed mass in the RIGHT
inner breast at middle depth which has increased in size in
comparison to prior.

No suspicious mass, distortion, or microcalcifications are
identified to suggest presence of malignancy in the LEFT breast.
There are multiple round and oval masses which have waxed and waned
since prior exam. These are most consistent with benign cysts.
Barbell clip is noted in the LEFT upper outer breast at site of
benign MRI guided biopsy. Q clip is noted in the LEFT anterior
breast at site of benign ultrasound-guided biopsy.

On physical exam, no suspicious mass is appreciated.

Targeted ultrasound was performed of the RIGHT inner breast. At [DATE]
3 cm from the nipple, there is an oval circumscribed anechoic mass
with posterior acoustic enhancement and a single thin internal
septation. It measures 3 x 4 by 5 mm and is consistent with a benign
cluster of cysts. This corresponds to the site of mammographic
concern. An additional benign cyst is noted at 2 o'clock 4 cm from
the nipple which measures 4 x 2 x 3 mm.
IMPRESSION: 1. No mammographic evidence of malignancy bilaterally. No new
suspicious findings adjacent to site of biopsy-proven atypical
lobular hyperplasia.
2. Benign bilateral breast cysts.

RECOMMENDATION:
Recommend annual high risk screening protocol with annual screening
mammogram and annual screening MRI given increase lifetime risk of
breast cancer conferred by diagnosis of atypical lobular
hyperplasia. Annual screening MRI would be due [DATE].

The American Cancer Society recommends annual MRI and mammography in
patients with an estimated lifetime risk of developing breast cancer
greater than 20 - 25%, or who are known or suspected to be positive
for the breast cancer gene.

I have discussed the findings and recommendations with the patient
with the assistance of a Spanish interpreter. If applicable, a
reminder letter will be sent to the patient regarding the next
appointment.

BI-RADS CATEGORY  2: Benign.

## 2021-12-10 IMAGING — US US BREAST*R* LIMITED INC AXILLA
1 series · 10 of 10 positions shown · non-contrast
Comparison: Previous exam(s).

CLINICAL DATA: History of non spontaneous bilateral nipple
discharge and a benign ultrasound-guided LEFT breast biopsy
demonstrating fibrocystic change and cystic papillary apocrine
metaplasia in [DATE] (Q clip). Patient subsequently underwent
bilateral breast MRI and bilateral breast MRI guided biopsies. RIGHT
breast MRI guided biopsy demonstrated pseudoangiomatous stromal
hyperplasia and atypical lobular hyperplasia (CYLINDER clip). LEFT
breast biopsy demonstrated fibrocystic change (barbell clip).
Patient has elected for short-term follow-up.

EXAM:
DIGITAL DIAGNOSTIC BILATERAL MAMMOGRAM WITH TOMOSYNTHESIS AND CAD;
ULTRASOUND RIGHT BREAST LIMITED
TECHNIQUE: Bilateral digital diagnostic mammography and breast tomosynthesis
was performed. The images were evaluated with computer-aided
detection.; Targeted ultrasound examination of the right breast was
performed

[Series 1: us breast*right* limited inc axilla · 0.05mm/px · 10 of 10 slices shown]
[im 1/10]
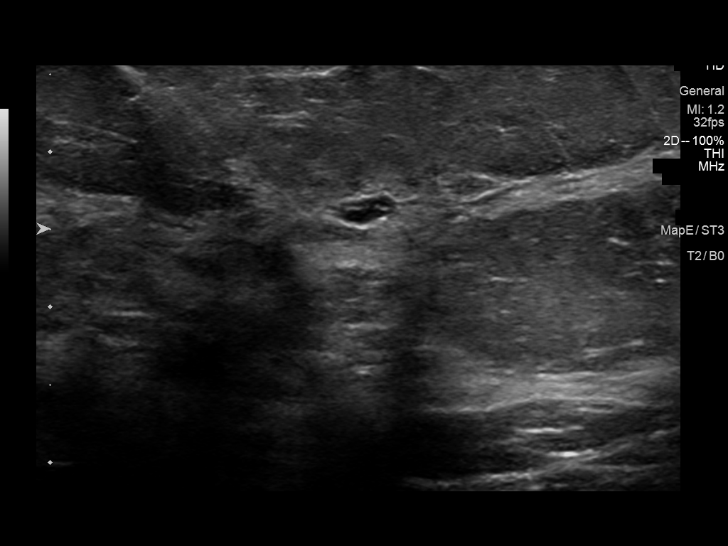
[im 2/10]
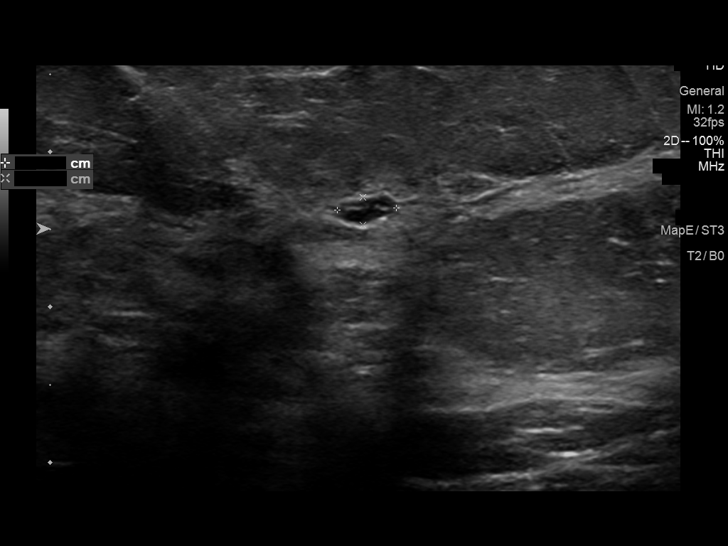
[im 3/10]
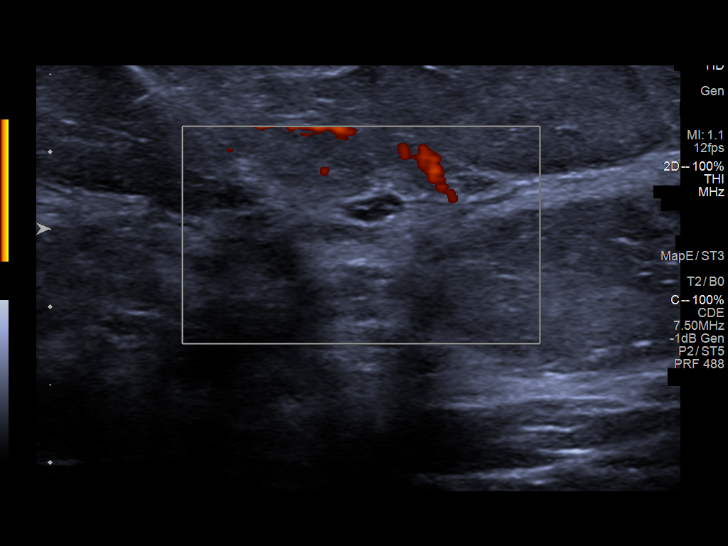
[im 4/10]
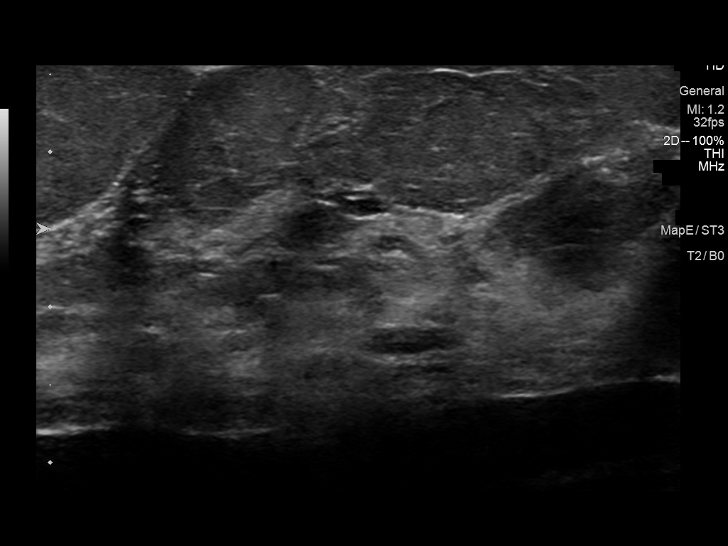
[im 5/10]
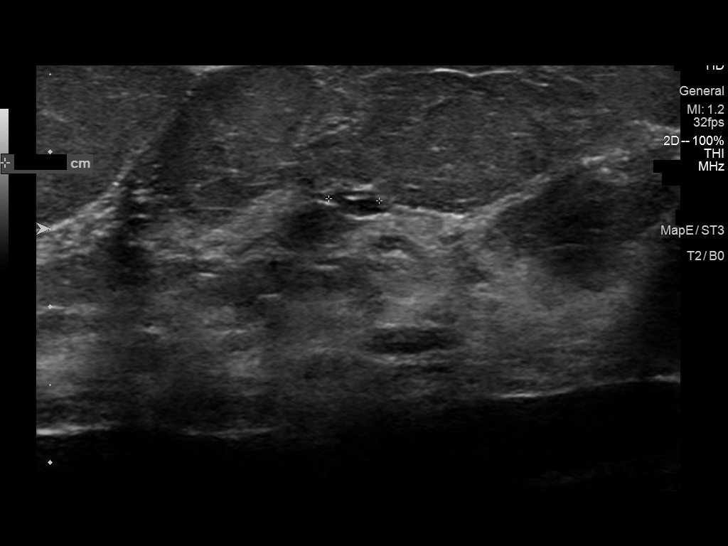
[im 6/10]
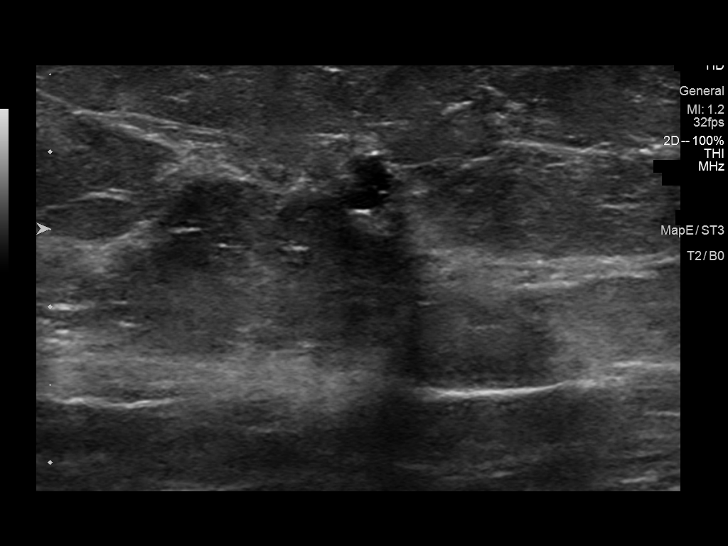
[im 7/10]
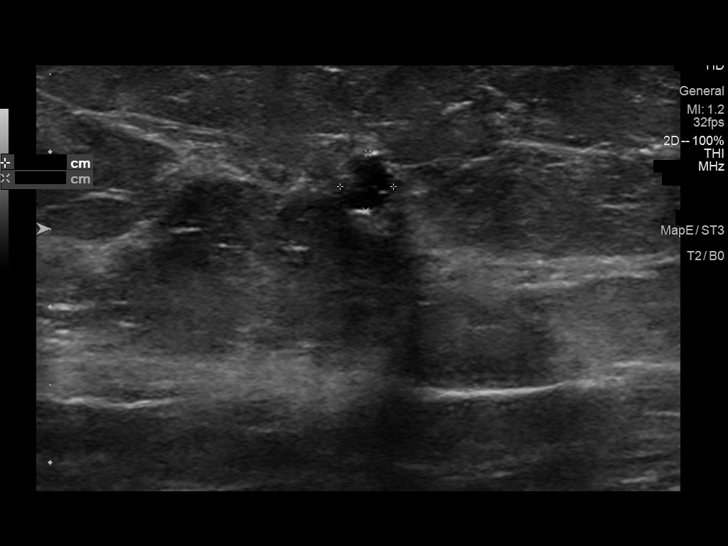
[im 8/10]
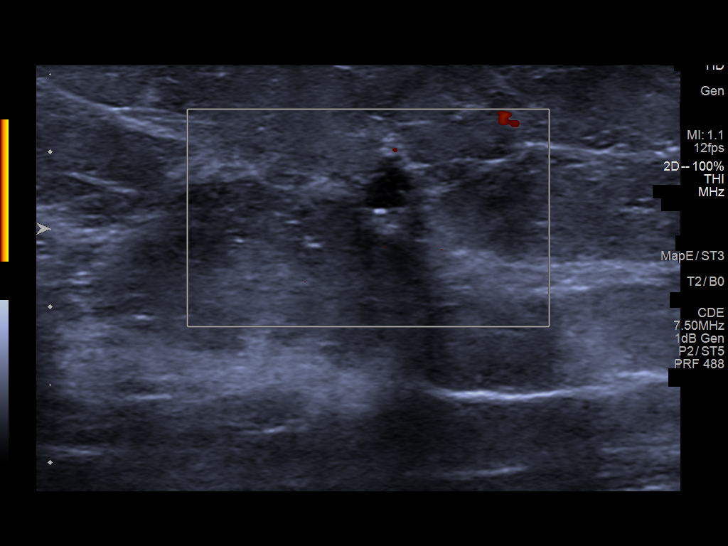
[im 9/10]
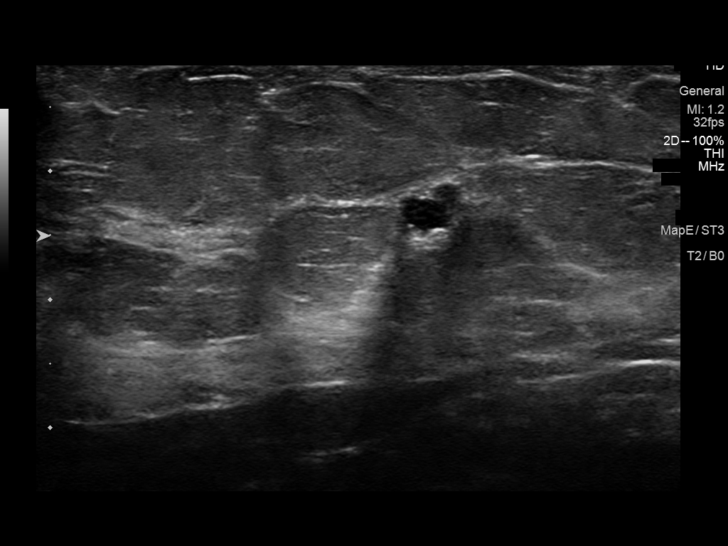
[im 10/10]
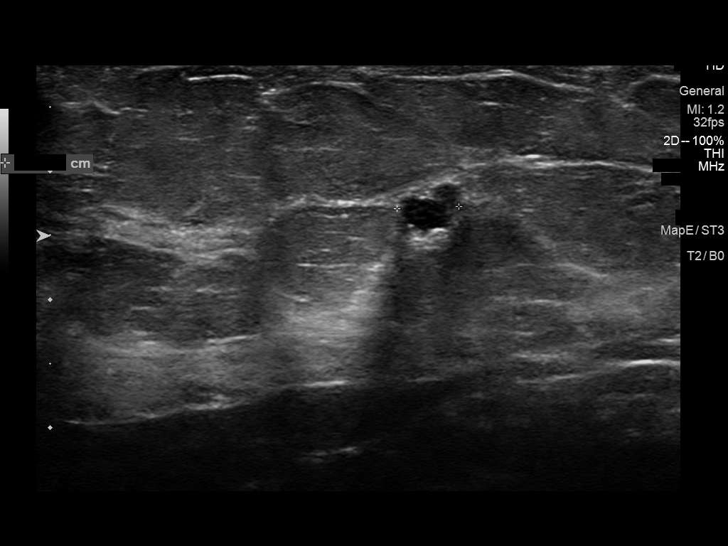

[10 of 10 positions shown; findings below may reference images not displayed]

ACR Breast Density Category c: The breast tissue is heterogeneously
dense, which may obscure small masses.
FINDINGS: Diagnostic images of the RIGHT breast demonstrate a CYLINDER shaped
clip in the RIGHT breast at site of biopsy pseudoangiomatous stromal
hyperplasia with atypical lobular hyperplasia. No adjacent
suspicious changes. Spot compression tomosynthesis views of the
RIGHT breast demonstrate an oval circumscribed mass in the RIGHT
inner breast at middle depth which has increased in size in
comparison to prior.

No suspicious mass, distortion, or microcalcifications are
identified to suggest presence of malignancy in the LEFT breast.
There are multiple round and oval masses which have waxed and waned
since prior exam. These are most consistent with benign cysts.
Barbell clip is noted in the LEFT upper outer breast at site of
benign MRI guided biopsy. Q clip is noted in the LEFT anterior
breast at site of benign ultrasound-guided biopsy.

On physical exam, no suspicious mass is appreciated.

Targeted ultrasound was performed of the RIGHT inner breast. At [DATE]
3 cm from the nipple, there is an oval circumscribed anechoic mass
with posterior acoustic enhancement and a single thin internal
septation. It measures 3 x 4 by 5 mm and is consistent with a benign
cluster of cysts. This corresponds to the site of mammographic
concern. An additional benign cyst is noted at 2 o'clock 4 cm from
the nipple which measures 4 x 2 x 3 mm.
IMPRESSION: 1. No mammographic evidence of malignancy bilaterally. No new
suspicious findings adjacent to site of biopsy-proven atypical
lobular hyperplasia.
2. Benign bilateral breast cysts.

RECOMMENDATION:
Recommend annual high risk screening protocol with annual screening
mammogram and annual screening MRI given increase lifetime risk of
breast cancer conferred by diagnosis of atypical lobular
hyperplasia. Annual screening MRI would be due [DATE].

The American Cancer Society recommends annual MRI and mammography in
patients with an estimated lifetime risk of developing breast cancer
greater than 20 - 25%, or who are known or suspected to be positive
for the breast cancer gene.

I have discussed the findings and recommendations with the patient
with the assistance of a Spanish interpreter. If applicable, a
reminder letter will be sent to the patient regarding the next
appointment.

BI-RADS CATEGORY  2: Benign.

## 2021-12-10 NOTE — Progress Notes (Signed)
Ms. Sally Ellis is a 46 y.o. female who presents to [facility] clinic today with complaint of right breast lump and pain for over one year. Patient states the pain comes and goes. Patient rates the pain at a 3-10 out of 10. Complaints of bilateral brownish colored nipple discharge when expressed x one year. ?  ?Pap Smear: Pap smear not completed today. Last Pap smear was 10/08/2020 at [facility] clinic and was normal with negative HPV. Per patient has no history of an abnormal Pap smear. Last Pap smear result is available in Epic. ?  ?Physical exam: ?Breasts ?Breasts symmetrical. No skin abnormalities bilateral breasts. No nipple retraction bilateral breasts. No nipple discharge bilateral breasts. Unable to express any nipple discharge on exam. No lymphadenopathy. No lumps palpated bilateral breasts. Unable to palpate any breast lumps on exam. Complaints of right breast tenderness around 12 o'clock on exam.    ? ?MS DIGITAL DIAG TOMO BILAT ? ?Result Date: 10/23/2020 ?CLINICAL DATA:  Here for evaluation of bilateral dark yellow nipple discharge which has been present for 1 year. The nipple discharge is primarily non spontaneous, but occasionally it is noted spontaneously by the patient. The patient also reports nonfocal breast pain in the lower and outer right breast. EXAM: DIGITAL DIAGNOSTIC BILATERAL MAMMOGRAM WITH TOMOSYNTHESIS AND CAD; ULTRASOUND LEFT BREAST LIMITED; ULTRASOUND RIGHT BREAST LIMITED TECHNIQUE: Bilateral digital diagnostic mammography and breast tomosynthesis was performed. The images were evaluated with computer-aided detection.; Targeted ultrasound examination of the left breast was performed; Targeted ultrasound examination of the right breast was performed COMPARISON:  None. ACR Breast Density Category c: The breast tissue is heterogeneously dense, which may obscure small masses. FINDINGS: Additional views including spot compression demonstrate a 5 mm oval  circumscribed mass in the lower outer left breast at posterior depth. Spot compression was obtained in the retroareolar area of both breasts. No suspicious mammographic finding is identified in these areas. No suspicious mammographic finding is identified in the right breast to explain the patient's symptoms. No suspicious mass, microcalcification, or other finding is identified in the right breast. Targeted left breast ultrasound was performed in the area of the mammographic mass. At 5 o'clock 2 cm from the nipple a benign simple cyst measures 7 x 4 x 5 mm. This corresponds to the mass seen on the mammogram. Targeted bilateral retroareolar ultrasound was performed. In the retroareolar left breast at 1 o'clock a hypoechoic intraductal mass measures 5 x 4 x 4 mm. This does not have a definite mammographic correlate. No abnormally enlarged left axillary lymph node is identified. In the retroareolar right breast, no suspicious solid or cystic mass is identified. No findings to explain the patient's symptoms. Targeted right breast ultrasound was performed in the area of pain by the physician. No suspicious solid or cystic mass is identified. No findings to explain the patient's symptoms. IMPRESSION: 1. Hypoechoic intraductal mass in the left breast is suspicious for malignancy. 2. No evidence of malignancy in the right breast or findings to explain the patient's symptoms in the right breast. RECOMMENDATION: 1. Recommend ultrasound-guided left breast biopsy. 2. Any further workup of the patient's symptoms should be based on the clinical assessment. I have discussed the findings and recommendations with the patient. If applicable, a reminder letter will be sent to the patient regarding the next appointment. BI-RADS CATEGORY  4: Suspicious. Electronically Signed   By: Romona Curls M.D.   On: 10/23/2020 11:11  ? ?MS DIGITAL DIAG UNI LEFT ? ?Result Date:  11/12/2020 ?CLINICAL DATA:  Patient status post ultrasound-guided core  needle biopsy retroareolar left breast mass. EXAM: DIAGNOSTIC LEFT MAMMOGRAM POST ULTRASOUND BIOPSY COMPARISON:  Previous exam(s). FINDINGS: Mammographic images were obtained following ultrasound guided biopsy of left breast mass 1 o'clock position. The biopsy marking clip is in expected position at the site of biopsy. IMPRESSION: Appropriate positioning of the Q shaped biopsy marking clip at the site of biopsy in the left breast mass 1 o'clock position. Final Assessment: Post Procedure Mammograms for Marker Placement Electronically Signed   By: Annia Belt M.D.   On: 11/12/2020 08:54  ? ?MM CLIP PLACEMENT LEFT ? ?Result Date: 02/24/2021 ?CLINICAL DATA:  Status post MR guided core biopsies of both breast. EXAM: 3D DIAGNOSTIC BILATERAL MAMMOGRAM POST MRI BIOPSIES COMPARISON:  Previous exam(s). FINDINGS: 3D Mammographic images were obtained following MRI guided biopsy of both breast. The biopsy marking clip clips are in expected position at the site of biopsy. IMPRESSION: Appropriate positioning of the cylindrical shaped clip in the 12 o'clock region of the right breast and barbell shaped clip in the posterior aspect of the left breast. Final Assessment: Post Procedure Mammograms for Marker Placement Electronically Signed   By: Baird Lyons M.D.   On: 02/24/2021 11:12 ? ?MM CLIP PLACEMENT RIGHT ? ?Result Date: 02/24/2021 ?CLINICAL DATA:  Status post MR guided core biopsies of both breast. EXAM: 3D DIAGNOSTIC BILATERAL MAMMOGRAM POST MRI BIOPSIES COMPARISON:  Previous exam(s). FINDINGS: 3D Mammographic images were obtained following MRI guided biopsy of both breast. The biopsy marking clip clips are in expected position at the site of biopsy. IMPRESSION: Appropriate positioning of the cylindrical shaped clip in the 12 o'clock region of the right breast and barbell shaped clip in the posterior aspect of the left breast. Final Assessment: Post Procedure Mammograms for Marker Placement Electronically Signed   By: Baird Lyons  M.D.   On: 02/24/2021 11:12  ? ?Pelvic/Bimanual ?Pap is not indicated today per BCCCP guidelines.  ?  ?Smoking History: ?Patient has never smoked. ?Patient Navigation: ?Patient education provided. Access to services provided for patient through [employer] program. Spanish interpreter Delos Haring from [facility] provided.  ? ?Colorectal Cancer Screening: ?Per patient has a history of a colonoscopy 2 years ago that was negative. No complaints today.  ?  ?Breast and Cervical Cancer Risk Assessment: ?Patient does not have family history of breast cancer, known genetic mutations, or radiation treatment to the chest before age 8. Patient does not have history of cervical dysplasia, immunocompromised, or DES exposure in-utero. ? ?Risk Assessment   ? ? Risk Scores   ? ?   12/10/2021 10/23/2020  ? Last edited by: Lesle Chris, RN Scarlett Presto, RN  ? 5-year risk: 0.5 % 0.5 %  ? Lifetime risk: 5.4 % 5.6 %  ? ?  ?  ? ?  ? ? ?A: ?BCCCP exam without pap smear ?Complaint of right breast lump and pain. Complaints of bilateral breast discharge. ? ?P: ?Referred patient to the [facility] for a diagnostic mammogram. Appointment scheduled Wednesday, December 10, 2021 at 1420. ? ?Priscille Heidelberg, RN ?12/10/2021 1:29 PM   ?

## 2021-12-10 NOTE — Patient Instructions (Signed)
Explained breast self awareness with Canary Brim. Patient did not need a Pap smear today due to last Pap smear and HPV typing was 10/08/2020. Let her know BCCCP will cover Pap smears and HPV typing every 5 years unless has a history of abnormal Pap smears. Referred patient to the [facility] for a diagnostic mammogram. Appointment scheduled Wednesday, December 10, 2021 at 1420. Patient aware of appointment and will be there. Tate Jerkins Escudero verbalized understanding. ? ?Ellese Julius, Kathaleen Maser, RN ?1:29 PM ? ? ? ? ?

## 2021-12-12 ENCOUNTER — Other Ambulatory Visit: Payer: Self-pay | Admitting: *Deleted

## 2021-12-12 DIAGNOSIS — Z1231 Encounter for screening mammogram for malignant neoplasm of breast: Secondary | ICD-10-CM

## 2021-12-15 ENCOUNTER — Other Ambulatory Visit: Payer: Self-pay | Admitting: Obstetrics and Gynecology

## 2021-12-15 DIAGNOSIS — Z9189 Other specified personal risk factors, not elsewhere classified: Secondary | ICD-10-CM

## 2022-02-13 ENCOUNTER — Ambulatory Visit
Admission: RE | Admit: 2022-02-13 | Discharge: 2022-02-13 | Disposition: A | Discharge status: Home / Self Care | Payer: Self-pay | Source: Ambulatory Visit | Attending: Obstetrics and Gynecology | Admitting: Obstetrics and Gynecology

## 2022-02-13 DIAGNOSIS — Z9189 Other specified personal risk factors, not elsewhere classified: Secondary | ICD-10-CM

## 2022-02-13 IMAGING — MR MR BREAST BILAT WO/W CM
2 of 9 series · 6 of 48 positions shown · IV contrast (7 ml Gadavist)
Comparison: Previous exams including breast MRI dated [DATE].

CLINICAL DATA: History of non spontaneous bilateral nipple
discharge.
TECHNIQUE: Multiplanar, multisequence MR images of both breasts were obtained
prior to and following the intravenous administration of 7 ml of
Gadavist

[Series 2: T1 · axial · B · 1.5mm · 1.02mm/px · z∈[-98,+69]mm · 5 of 112 slices shown]
[im 1/112]
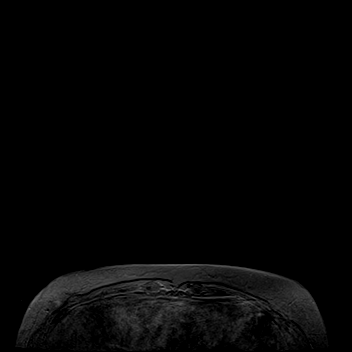
[im 28/112]
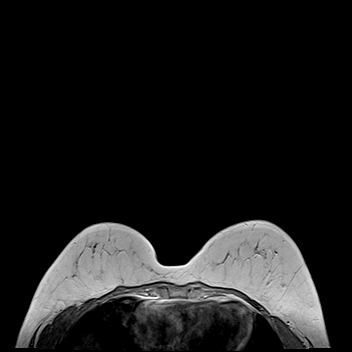
[im 56/112]
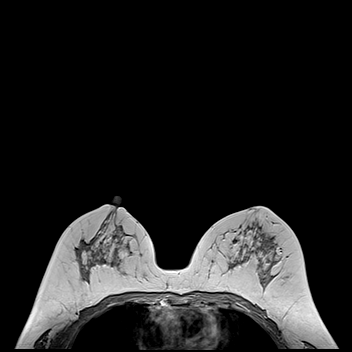
[im 84/112]
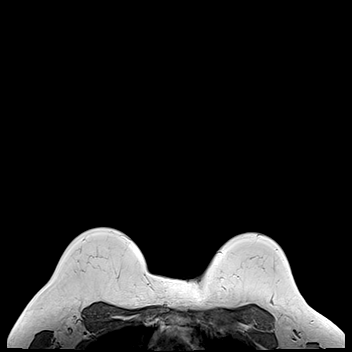
[im 112/112]
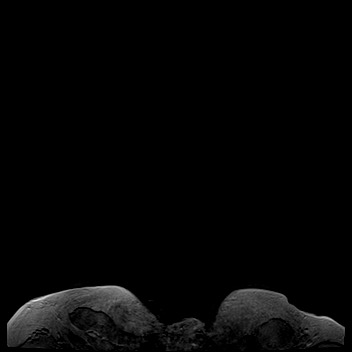

[Series 3: T2 · axial · B · 3.0mm · 1.02mm/px · 1 of 46 slices shown]
[im 1/46]
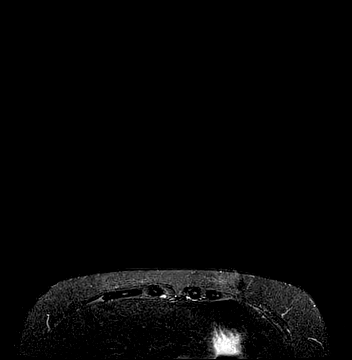

[6 of 48 positions shown; findings below may reference images not displayed]

History of benign ultrasound-guided LEFT breast biopsy in [DATE] showing fibrocystic change and papillary apocrine metaplasia.

Patient had subsequent MRI-guided biopsies in [DATE] with
RIGHT breast biopsy showing atypical lobular hyperplasia with
fibrocystic change and pseudoangiomatous stromal hyperplasia (RIGHT
breast, 12 o'clock axis, cylinder shaped clip) and LEFT breast
biopsy showing benign fibrocystic change with apocrine metaplasia.
Patient has elected for imaging surveillance for the RIGHT breast
ALH.

EXAM:
BILATERAL BREAST MRI WITH AND WITHOUT CONTRAST
Three-dimensional MR images were rendered by post-processing of the
original MR data on an independent workstation. The
three-dimensional MR images were interpreted, and findings are
reported in the following complete MRI report for this study. Three
dimensional images were evaluated at the independent interpreting
workstation using the DynaCAD thin client.
FINDINGS: Breast composition: c. Heterogeneous fibroglandular tissue.

Background parenchymal enhancement: Moderate.

Right breast: Residual focal non-mass enhancement within the upper
RIGHT breast, 12 o'clock axis region, with associated biopsy clip
artifact, corresponding to the site of earlier MRI-guided biopsy
revealing ALH. This non-mass enhancement is stable to slightly
diminished compared to the previous pre-biopsy MRI of [DATE].

No new suspicious mass, suspicious non-mass enhancement or secondary
signs of malignancy within the RIGHT breast.

Scattered small benign cysts.

Left breast: Biopsy clip artifact within the inner central LEFT
breast corresponds to the earlier MRI-guided biopsy revealing benign
fibrocystic change and apocrine metaplasia. The associated
surrounding non-mass enhancement is stable.

No new suspicious mass, suspicious non-mass enhancement or secondary
signs of malignancy within the LEFT breast.

Scattered small benign cysts.

Lymph nodes: No abnormal appearing lymph nodes.

Ancillary findings:  None.
IMPRESSION: 1. Known biopsy-proven ALH within the upper RIGHT breast, 12 o'clock
axis region, with residual focal non-mass enhancement at the biopsy
site which is stable to slightly diminished compared to the
pre-biopsy MRI of [DATE]. Recommend additional follow-up breast
MRI in 12 months to ensure continued stability.
2. No evidence of malignancy within either breast.

RECOMMENDATION:
1. Breast MRI with contrast in 12 months.
2. Annual bilateral screening mammogram will be due in [place] of

BI-RADS CATEGORY  2: Benign.

## 2022-02-13 MED ORDER — GADOBUTROL 1 MMOL/ML IV SOLN
7.0000 mL | Freq: Once | INTRAVENOUS | Status: AC | PRN
  Administered 2022-02-13: 7 mL via INTRAVENOUS

## 2022-02-16 ENCOUNTER — Other Ambulatory Visit: Payer: Self-pay | Admitting: Obstetrics and Gynecology

## 2022-02-16 DIAGNOSIS — Z9189 Other specified personal risk factors, not elsewhere classified: Secondary | ICD-10-CM

## 2022-12-07 ENCOUNTER — Other Ambulatory Visit: Payer: Self-pay | Admitting: Hematology and Oncology

## 2022-12-07 DIAGNOSIS — N6310 Unspecified lump in the right breast, unspecified quadrant: Secondary | ICD-10-CM

## 2022-12-16 ENCOUNTER — Other Ambulatory Visit: Payer: Self-pay

## 2022-12-16 DIAGNOSIS — Z1231 Encounter for screening mammogram for malignant neoplasm of breast: Secondary | ICD-10-CM

## 2022-12-28 ENCOUNTER — Ambulatory Visit: Payer: Self-pay | Attending: Hematology and Oncology | Admitting: Hematology and Oncology

## 2022-12-28 ENCOUNTER — Ambulatory Visit
Admission: RE | Admit: 2022-12-28 | Discharge: 2022-12-28 | Disposition: A | Discharge status: Home / Self Care | Payer: Self-pay | Source: Ambulatory Visit | Attending: Obstetrics and Gynecology | Admitting: Obstetrics and Gynecology

## 2022-12-28 ENCOUNTER — Ambulatory Visit: Payer: Self-pay

## 2022-12-28 VITALS — BP 109/73 | Wt 153.6 lb

## 2022-12-28 DIAGNOSIS — Z1231 Encounter for screening mammogram for malignant neoplasm of breast: Secondary | ICD-10-CM | POA: Insufficient documentation

## 2022-12-28 NOTE — Progress Notes (Signed)
Sally Ellis is a 47 y.o. female who presents to [facility] clinic today with no complaints.    Pap Smear: Pap not smear completed today. Last Pap smear was 2022 at [facility] clinic and was normal. Per patient has history of an abnormal Pap smear. Last Pap smear result is available in Epic. 1 abnormal with colposcopy in the past with 3 normal since then.    Physical exam: Breasts Breasts symmetrical. No skin abnormalities bilateral breasts. No nipple retraction bilateral breasts. No nipple discharge bilateral breasts. No lymphadenopathy. No lumps palpated bilateral breasts.   MS DIGITAL DIAG TOMO BILAT  Result Date: 12/10/2021 CLINICAL DATA:  History of non spontaneous bilateral nipple discharge and a benign ultrasound-guided LEFT breast biopsy demonstrating fibrocystic change and cystic papillary apocrine metaplasia in March of 2022 (Q clip). Patient subsequently underwent bilateral breast MRI and bilateral breast MRI guided biopsies. 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 LEFT breast biopsy demonstrated fibrocystic change (barbell clip). Patient has elected for short-term follow-up. EXAM: DIGITAL DIAGNOSTIC BILATERAL MAMMOGRAM WITH TOMOSYNTHESIS AND CAD; ULTRASOUND RIGHT BREAST LIMITED TECHNIQUE: Bilateral digital diagnostic mammography and breast tomosynthesis was performed. The images were evaluated with computer-aided detection.; Targeted ultrasound examination of the right breast was performed COMPARISON:  Previous exam(s). ACR Breast Density Category c: The breast tissue is heterogeneously dense, which may obscure small masses. FINDINGS: Diagnostic images of the RIGHT breast demonstrate a CYLINDER shaped clip in the RIGHT breast at site of biopsy pseudoangiomatous stromal hyperplasia with atypical lobular hyperplasia. No adjacent suspicious changes. Spot compression tomosynthesis views of the  RIGHT breast demonstrate an oval circumscribed mass in the RIGHT inner breast at middle depth which has increased in size in comparison to prior. No suspicious mass, distortion, or microcalcifications are identified to suggest presence of malignancy in the LEFT breast. There are multiple round and oval masses which have waxed and waned since prior exam. These are most consistent with benign cysts. Barbell clip is noted in the LEFT upper outer breast at site of benign MRI guided biopsy. Q clip is noted in the LEFT anterior breast at site of benign ultrasound-guided biopsy. On physical exam, no suspicious mass is appreciated. Targeted ultrasound was performed of the RIGHT inner breast. At 2:30 3 cm from the nipple, there is an oval circumscribed anechoic mass with posterior acoustic enhancement and a single thin internal septation. It measures 3 x 4 by 5 mm and is consistent with a benign cluster of cysts. This corresponds to the site of mammographic concern. An additional benign cyst is noted at 2 o'clock 4 cm from the nipple which measures 4 x 2 x 3 mm. IMPRESSION: 1. No mammographic evidence of malignancy bilaterally. No new suspicious findings adjacent to site of biopsy-proven atypical lobular hyperplasia. 2. Benign bilateral breast cysts. RECOMMENDATION: Recommend annual high risk screening protocol with annual screening mammogram and annual screening MRI given increase lifetime risk of breast cancer conferred by diagnosis of atypical lobular hyperplasia. Annual screening MRI would be due June 2023. The American Cancer Society recommends annual MRI and mammography in patients with an estimated lifetime risk of developing breast cancer greater than 20 - 25%, or who are known or suspected to be positive for the breast cancer gene. I have discussed the findings and recommendations with the patient with the assistance of a Spanish interpreter. If applicable, a reminder letter will be sent to the patient regarding the  next appointment. BI-RADS CATEGORY  2:  Benign. Electronically Signed   By: Meda Klinefelter M.D.   On: 12/10/2021 15:41  MM CLIP PLACEMENT LEFT  Result Date: 02/24/2021 CLINICAL DATA:  Status post MR guided core biopsies of both breast. EXAM: 3D DIAGNOSTIC BILATERAL MAMMOGRAM POST MRI BIOPSIES COMPARISON:  Previous exam(s). FINDINGS: 3D Mammographic images were obtained following MRI guided biopsy of both breast. The biopsy marking clip clips are in expected position at the site of biopsy. IMPRESSION: Appropriate positioning of the cylindrical shaped clip in the 12 o'clock region of the right breast and barbell shaped clip in the posterior aspect of the left breast. Final Assessment: Post Procedure Mammograms for Marker Placement Electronically Signed   By: Baird Lyons M.D.   On: 02/24/2021 11:12  MM CLIP PLACEMENT RIGHT  Result Date: 02/24/2021 CLINICAL DATA:  Status post MR guided core biopsies of both breast. EXAM: 3D DIAGNOSTIC BILATERAL MAMMOGRAM POST MRI BIOPSIES COMPARISON:  Previous exam(s). FINDINGS: 3D Mammographic images were obtained following MRI guided biopsy of both breast. The biopsy marking clip clips are in expected position at the site of biopsy. IMPRESSION: Appropriate positioning of the cylindrical shaped clip in the 12 o'clock region of the right breast and barbell shaped clip in the posterior aspect of the left breast. Final Assessment: Post Procedure Mammograms for Marker Placement Electronically Signed   By: Baird Lyons M.D.   On: 02/24/2021 11:12  MS DIGITAL DIAG UNI LEFT  Result Date: 11/12/2020 CLINICAL DATA:  Patient status post ultrasound-guided core needle biopsy retroareolar left breast mass. EXAM: DIAGNOSTIC LEFT MAMMOGRAM POST ULTRASOUND BIOPSY COMPARISON:  Previous exam(s). FINDINGS: Mammographic images were obtained following ultrasound guided biopsy of left breast mass 1 o'clock position. The biopsy marking clip is in expected position at the site of biopsy.  IMPRESSION: Appropriate positioning of the Q shaped biopsy marking clip at the site of biopsy in the left breast mass 1 o'clock position. Final Assessment: Post Procedure Mammograms for Marker Placement Electronically Signed   By: Annia Belt M.D.   On: 11/12/2020 08:54   MS DIGITAL DIAG TOMO BILAT  Result Date: 10/23/2020 CLINICAL DATA:  Here for evaluation of bilateral dark yellow nipple discharge which has been present for 1 year. The nipple discharge is primarily non spontaneous, but occasionally it is noted spontaneously by the patient. The patient also reports nonfocal breast pain in the lower and outer right breast. EXAM: DIGITAL DIAGNOSTIC BILATERAL MAMMOGRAM WITH TOMOSYNTHESIS AND CAD; ULTRASOUND LEFT BREAST LIMITED; ULTRASOUND RIGHT BREAST LIMITED TECHNIQUE: Bilateral digital diagnostic mammography and breast tomosynthesis was performed. The images were evaluated with computer-aided detection.; Targeted ultrasound examination of the left breast was performed; Targeted ultrasound examination of the right breast was performed COMPARISON:  None. ACR Breast Density Category c: The breast tissue is heterogeneously dense, which may obscure small masses. FINDINGS: Additional views including spot compression demonstrate a 5 mm oval circumscribed mass in the lower outer left breast at posterior depth. Spot compression was obtained in the retroareolar area of both breasts. No suspicious mammographic finding is identified in these areas. No suspicious mammographic finding is identified in the right breast to explain the patient's symptoms. No suspicious mass, microcalcification, or other finding is identified in the right breast. Targeted left breast ultrasound was performed in the area of the mammographic mass. At 5 o'clock 2 cm from the nipple a benign simple cyst measures 7 x 4 x 5 mm. This corresponds to the mass seen on the mammogram. Targeted bilateral retroareolar ultrasound was performed. In the  retroareolar left  breast at 1 o'clock a hypoechoic intraductal mass measures 5 x 4 x 4 mm. This does not have a definite mammographic correlate. No abnormally enlarged left axillary lymph node is identified. In the retroareolar right breast, no suspicious solid or cystic mass is identified. No findings to explain the patient's symptoms. Targeted right breast ultrasound was performed in the area of pain by the physician. No suspicious solid or cystic mass is identified. No findings to explain the patient's symptoms. IMPRESSION: 1. Hypoechoic intraductal mass in the left breast is suspicious for malignancy. 2. No evidence of malignancy in the right breast or findings to explain the patient's symptoms in the right breast. RECOMMENDATION: 1. Recommend ultrasound-guided left breast biopsy. 2. Any further workup of the patient's symptoms should be based on the clinical assessment. I have discussed the findings and recommendations with the patient. If applicable, a reminder letter will be sent to the patient regarding the next appointment. BI-RADS CATEGORY  4: Suspicious. Electronically Signed   By: Romona Curls M.D.   On: 10/23/2020 11:11        Pelvic/Bimanual Pap is not indicated today    Smoking History: Patient has never smoked and was not referred to quit line.    Patient Navigation: Patient education provided. Access to services provided for patient through [facility] program. Aliene Beams interpreter provided. No transportation provided   Colorectal Cancer Screening: Per patient has never had colonoscopy completed No complaints today.    Breast and Cervical Cancer Risk Assessment: Patient does not have family history of breast cancer, known genetic mutations, or radiation treatment to the chest before age 45. Patient has history of cervical dysplasia, immunocompromised, or DES exposure in-utero.  Risk Assessment   No risk assessment data for the current encounter  Risk Scores       12/10/2021    Last edited by: Lesle Chris, RN   5-year risk: 0.5 %   Lifetime risk: 5.4 %            A: [facility] exam without pap smear No complaints with benign exam.   P: [facility]. Appointment scheduled 12/28/22.  Pascal Lux, NP 12/28/2022 11:47 AM

## 2022-12-28 NOTE — Patient Instructions (Signed)
Taught Sally Ellis about self breast awareness and gave educational materials to take home. Patient did not need a Pap smear today due to last Pap smear was in 2022 per patient. Let her know [facility] will cover Pap smears every 5 years unless has a history of abnormal Pap smears. [facility]. Appointment scheduled for 12/28/22. Patient aware of appointment and will be there. Let patient know will follow up with her within the next couple weeks with results. Delberta Folts Ellis verbalized understanding.  Pascal Lux, NP 12:30 PM

## 2023-03-31 ENCOUNTER — Ambulatory Visit
Admission: RE | Admit: 2023-03-31 | Discharge: 2023-03-31 | Disposition: A | Discharge status: Home / Self Care | Payer: Self-pay | Source: Ambulatory Visit | Attending: Hematology and Oncology | Admitting: Hematology and Oncology

## 2023-03-31 DIAGNOSIS — N6310 Unspecified lump in the right breast, unspecified quadrant: Secondary | ICD-10-CM

## 2023-03-31 MED ORDER — GADOBUTROL 1 MMOL/ML IV SOLN
7.0000 mL | Freq: Once | INTRAVENOUS | Status: AC | PRN
  Administered 2023-03-31: 7 mL via INTRAVENOUS

## 2023-12-30 ENCOUNTER — Other Ambulatory Visit: Payer: Self-pay

## 2023-12-30 DIAGNOSIS — Z1231 Encounter for screening mammogram for malignant neoplasm of breast: Secondary | ICD-10-CM

## 2024-01-24 ENCOUNTER — Other Ambulatory Visit: Payer: Self-pay | Admitting: Family Medicine

## 2024-01-24 ENCOUNTER — Ambulatory Visit: Payer: Self-pay

## 2024-01-24 DIAGNOSIS — Z1231 Encounter for screening mammogram for malignant neoplasm of breast: Secondary | ICD-10-CM

## 2024-02-14 ENCOUNTER — Other Ambulatory Visit: Payer: Self-pay

## 2024-02-14 ENCOUNTER — Other Ambulatory Visit (HOSPITAL_COMMUNITY)
Admission: RE | Admit: 2024-02-14 | Discharge: 2024-02-14 | Disposition: A | Discharge status: Home / Self Care | Payer: Self-pay | Source: Ambulatory Visit | Attending: Obstetrics and Gynecology | Admitting: Obstetrics and Gynecology

## 2024-02-14 ENCOUNTER — Ambulatory Visit: Payer: Self-pay

## 2024-02-14 ENCOUNTER — Ambulatory Visit: Payer: Self-pay | Attending: Obstetrics and Gynecology | Admitting: *Deleted

## 2024-02-14 VITALS — BP 104/71 | Wt 160.3 lb

## 2024-02-14 DIAGNOSIS — N6452 Nipple discharge: Secondary | ICD-10-CM | POA: Insufficient documentation

## 2024-02-14 DIAGNOSIS — Z124 Encounter for screening for malignant neoplasm of cervix: Secondary | ICD-10-CM

## 2024-02-14 DIAGNOSIS — Z01419 Encounter for gynecological examination (general) (routine) without abnormal findings: Secondary | ICD-10-CM

## 2024-02-14 NOTE — Patient Instructions (Signed)
 Explained breast self awareness with Sally Ellis. Pap smear completed today. Let her know [facility] will cover Pap smears and HPV typing every 5 years unless has a history of abnormal Pap smears. Referred patient to the [facility] for a diagnostic mammogram. Appointment scheduled Thursday, February 17, 2024 at 1340. Patient aware of appointment and will be there. Let patient know will follow up with her within the next couple weeks with results of Pap smear and breast discharge by letter or phone. [name] verbalized understanding.  Lonn Im, Dela Favor, RN 10:36 AM

## 2024-02-14 NOTE — Progress Notes (Signed)
 Ms. Sally Ellis is a 48 y.o. female who presents to [facility] clinic today with complaint of bilateral brownish colored breast discharge when expressed and pain x 6 months. Patient stated the pain comes and goes. Patient rates the pain at a 6-7 out of 10.    Pap Smear: Pap smear completed today. Last Pap smear was 10/08/2020 at [facility] clinic and was normal with negative HPV. Per patient has history of an abnormal Pap smear 4-6 years ago that a colposcopy was completed for follow up that only two normal Pap smears have been completed since colpscopy for follow up. Last Pap smear result is available in Epic.   Physical exam: Breasts Breasts symmetrical. No skin abnormalities bilateral breasts. No nipple retraction bilateral breasts. A dark green discharge was expressed from bilateral breasts on exam that was greater within the right breast. Sample of bilateral breast discharge sent to Cytology for evaluation. No lymphadenopathy. No lumps palpated bilateral breasts. No complaints of pain or tenderness on exam.  MS 3D SCR MAMMO BILAT BR (aka MM) Result Date: 12/30/2022 CLINICAL DATA:  Screening. EXAM: DIGITAL SCREENING BILATERAL MAMMOGRAM WITH TOMOSYNTHESIS AND CAD TECHNIQUE: Bilateral screening digital craniocaudal and mediolateral oblique mammograms were obtained. Bilateral screening digital breast tomosynthesis was performed. The images were evaluated with computer-aided detection. COMPARISON:  Previous exam(s). ACR Breast Density Category c: The breasts are heterogeneously dense, which may obscure small masses. FINDINGS: There are no findings suspicious for malignancy. IMPRESSION: No mammographic evidence of malignancy. A result letter of this screening mammogram will be mailed directly to the patient. RECOMMENDATION: Screening mammogram in one year. (Code:SM-B-01Y) BI-RADS CATEGORY  1: Negative. Electronically Signed   By: Alger Infield M.D.   On: 12/30/2022 08:47   MS DIGITAL DIAG TOMO  BILAT Result Date: 12/10/2021 CLINICAL DATA:  History of non spontaneous bilateral nipple discharge and a benign ultrasound-guided LEFT breast biopsy demonstrating fibrocystic change and cystic papillary apocrine metaplasia in March of 2022 (Q clip). Patient subsequently underwent bilateral breast MRI and bilateral breast MRI guided biopsies. 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 LEFT breast biopsy demonstrated fibrocystic change (barbell clip). Patient has elected for short-term follow-up. EXAM: DIGITAL DIAGNOSTIC BILATERAL MAMMOGRAM WITH TOMOSYNTHESIS AND CAD; ULTRASOUND RIGHT BREAST LIMITED TECHNIQUE: Bilateral digital diagnostic mammography and breast tomosynthesis was performed. The images were evaluated with computer-aided detection.; Targeted ultrasound examination of the right breast was performed COMPARISON:  Previous exam(s). ACR Breast Density Category c: The breast tissue is heterogeneously dense, which may obscure small masses. FINDINGS: Diagnostic images of the RIGHT breast demonstrate a CYLINDER shaped clip in the RIGHT breast at site of biopsy pseudoangiomatous stromal hyperplasia with atypical lobular hyperplasia. No adjacent suspicious changes. Spot compression tomosynthesis views of the RIGHT breast demonstrate an oval circumscribed mass in the RIGHT inner breast at middle depth which has increased in size in comparison to prior. No suspicious mass, distortion, or microcalcifications are identified to suggest presence of malignancy in the LEFT breast. There are multiple round and oval masses which have waxed and waned since prior exam. These are most consistent with benign cysts. Barbell clip is noted in the LEFT upper outer breast at site of benign MRI guided biopsy. Q clip is noted in the LEFT anterior breast at site of benign ultrasound-guided biopsy. On physical exam, no suspicious mass is  appreciated. Targeted ultrasound was performed of the RIGHT inner breast. At 2:30 3 cm from the nipple, there is an oval circumscribed  anechoic mass with posterior acoustic enhancement and a single thin internal septation. It measures 3 x 4 by 5 mm and is consistent with a benign cluster of cysts. This corresponds to the site of mammographic concern. An additional benign cyst is noted at 2 o'clock 4 cm from the nipple which measures 4 x 2 x 3 mm. IMPRESSION: 1. No mammographic evidence of malignancy bilaterally. No new suspicious findings adjacent to site of biopsy-proven atypical lobular hyperplasia. 2. Benign bilateral breast cysts. RECOMMENDATION: Recommend annual high risk screening protocol with annual screening mammogram and annual screening MRI given increase lifetime risk of breast cancer conferred by diagnosis of atypical lobular hyperplasia. Annual screening MRI would be due June 2023. The American Cancer Society recommends annual MRI and mammography in patients with an estimated lifetime risk of developing breast cancer greater than 20 - 25%, or who are known or suspected to be positive for the breast cancer gene. I have discussed the findings and recommendations with the patient with the assistance of a Spanish interpreter. If applicable, a reminder letter will be sent to the patient regarding the next appointment. BI-RADS CATEGORY  2: Benign. Electronically Signed   By: Clancy Crimes M.D.   On: 12/10/2021 15:41  MM CLIP PLACEMENT LEFT Result Date: 02/24/2021 CLINICAL DATA:  Status post MR guided core biopsies of both breast. EXAM: 3D DIAGNOSTIC BILATERAL MAMMOGRAM POST MRI BIOPSIES COMPARISON:  Previous exam(s). FINDINGS: 3D Mammographic images were obtained following MRI guided biopsy of both breast. The biopsy marking clip clips are in expected position at the site of biopsy. IMPRESSION: Appropriate positioning of the cylindrical shaped clip in the 12 o'clock region of the right breast and  barbell shaped clip in the posterior aspect of the left breast. Final Assessment: Post Procedure Mammograms for Marker Placement Electronically Signed   By: Delwin Files M.D.   On: 02/24/2021 11:12  MM CLIP PLACEMENT RIGHT Result Date: 02/24/2021 CLINICAL DATA:  Status post MR guided core biopsies of both breast. EXAM: 3D DIAGNOSTIC BILATERAL MAMMOGRAM POST MRI BIOPSIES COMPARISON:  Previous exam(s). FINDINGS: 3D Mammographic images were obtained following MRI guided biopsy of both breast. The biopsy marking clip clips are in expected position at the site of biopsy. IMPRESSION: Appropriate positioning of the cylindrical shaped clip in the 12 o'clock region of the right breast and barbell shaped clip in the posterior aspect of the left breast. Final Assessment: Post Procedure Mammograms for Marker Placement Electronically Signed   By: Delwin Files M.D.   On: 02/24/2021 11:12  MS DIGITAL DIAG UNI LEFT Result Date: 11/12/2020 CLINICAL DATA:  Patient status post ultrasound-guided core needle biopsy retroareolar left breast mass. EXAM: DIAGNOSTIC LEFT MAMMOGRAM POST ULTRASOUND BIOPSY COMPARISON:  Previous exam(s). FINDINGS: Mammographic images were obtained following ultrasound guided biopsy of left breast mass 1 o'clock position. The biopsy marking clip is in expected position at the site of biopsy. IMPRESSION: Appropriate positioning of the Q shaped biopsy marking clip at the site of biopsy in the left breast mass 1 o'clock position. Final Assessment: Post Procedure Mammograms for Marker Placement Electronically Signed   By: Jone Neither M.D.   On: 11/12/2020 08:54   MS DIGITAL DIAG TOMO BILAT Result Date: 10/23/2020 CLINICAL DATA:  Here for evaluation of bilateral dark yellow nipple discharge which has been present for 1 year. The nipple discharge is primarily non spontaneous, but occasionally it is noted spontaneously by the patient. The patient also reports nonfocal breast pain in the lower and outer right  breast.  EXAM: DIGITAL DIAGNOSTIC BILATERAL MAMMOGRAM WITH TOMOSYNTHESIS AND CAD; ULTRASOUND LEFT BREAST LIMITED; ULTRASOUND RIGHT BREAST LIMITED TECHNIQUE: Bilateral digital diagnostic mammography and breast tomosynthesis was performed. The images were evaluated with computer-aided detection.; Targeted ultrasound examination of the left breast was performed; Targeted ultrasound examination of the right breast was performed COMPARISON:  None. ACR Breast Density Category c: The breast tissue is heterogeneously dense, which may obscure small masses. FINDINGS: Additional views including spot compression demonstrate a 5 mm oval circumscribed mass in the lower outer left breast at posterior depth. Spot compression was obtained in the retroareolar area of both breasts. No suspicious mammographic finding is identified in these areas. No suspicious mammographic finding is identified in the right breast to explain the patient's symptoms. No suspicious mass, microcalcification, or other finding is identified in the right breast. Targeted left breast ultrasound was performed in the area of the mammographic mass. At 5 o'clock 2 cm from the nipple a benign simple cyst measures 7 x 4 x 5 mm. This corresponds to the mass seen on the mammogram. Targeted bilateral retroareolar ultrasound was performed. In the retroareolar left breast at 1 o'clock a hypoechoic intraductal mass measures 5 x 4 x 4 mm. This does not have a definite mammographic correlate. No abnormally enlarged left axillary lymph node is identified. In the retroareolar right breast, no suspicious solid or cystic mass is identified. No findings to explain the patient's symptoms. Targeted right breast ultrasound was performed in the area of pain by the physician. No suspicious solid or cystic mass is identified. No findings to explain the patient's symptoms. IMPRESSION: 1. Hypoechoic intraductal mass in the left breast is suspicious for malignancy. 2. No evidence of  malignancy in the right breast or findings to explain the patient's symptoms in the right breast. RECOMMENDATION: 1. Recommend ultrasound-guided left breast biopsy. 2. Any further workup of the patient's symptoms should be based on the clinical assessment. I have discussed the findings and recommendations with the patient. If applicable, a reminder letter will be sent to the patient regarding the next appointment. BI-RADS CATEGORY  4: Suspicious. Electronically Signed   By: Tita Form M.D.   On: 10/23/2020 11:11        Pelvic/Bimanual Ext Genitalia No lesions, no swelling and no discharge observed on external genitalia.        Vagina Vagina pink and normal texture. No lesions or discharge observed in vagina.        Cervix Cervix is present. Cervix pink and of normal texture. No discharge observed.    Uterus Uterus is present and palpable. Uterus in normal position and normal size.        Adnexae Bilateral ovaries present and palpable. No tenderness on palpation.         Rectovaginal No rectal exam completed today since patient had no rectal complaints. No skin abnormalities observed on exam.     Smoking History: Patient has never smoked.   Patient Navigation: Patient education provided. Access to services provided for patient through [employer] program. Spanish interpreter Francoise Ishihara from [facility] provided.   Colorectal Cancer Screening: Per patient has never had colonoscopy completed. Patient stated that she completed the Cologuard test 02/14/2024 that was negative given by her PCP. No complaints today.    Breast and Cervical Cancer Risk Assessment: Patient does not have family history of breast cancer, known genetic mutations, or radiation treatment to the chest before age 46. Per patient has history of cervical dysplasia. Patient has no history of  being immunocompromised or DES exposure in-utero.  Risk Scores as of Encounter on 02/14/2024     Gregary Lean           5-year 1.3%    Lifetime 11.14%   This patient is [place] but has no documented birth country, so the [place] model used data from [place] patients to calculate their risk score. Document a birth country in the Demographics activity for a more accurate score.         Last calculated by Algie Ingle, LPN on 09/12/1094 at 10:32 AM        A: B[facility]CP exam with pap smear Complaint of bilateral breast discharge and pain.  P: Referred patient to the [facility] for a diagnostic mammogram. Appointment scheduled Thursday, February 17, 2024 at 1340.  Stefan Edge, RN 02/14/2024 2:40 PM   Valda Garnet Blaine Bump, RN 02/14/2024 10:36 AM

## 2024-02-16 ENCOUNTER — Ambulatory Visit: Payer: Self-pay

## 2024-02-16 LAB — CYTOLOGY - PAP
Comment: NEGATIVE
Diagnosis: NEGATIVE
High risk HPV: NEGATIVE

## 2024-02-16 LAB — CYTOLOGY - NON PAP

## 2024-02-17 ENCOUNTER — Ambulatory Visit
Admission: RE | Admit: 2024-02-17 | Discharge: 2024-02-17 | Disposition: A | Discharge status: Home / Self Care | Payer: Self-pay | Source: Ambulatory Visit | Attending: Obstetrics and Gynecology | Admitting: Obstetrics and Gynecology

## 2024-02-17 DIAGNOSIS — N6452 Nipple discharge: Secondary | ICD-10-CM

## 2024-02-18 NOTE — Progress Notes (Signed)
 Hello Dr. Dodie Frees,  What is your recommendation for the attached breast discharge result?  Thanks, [employer]

## 2024-02-22 ENCOUNTER — Telehealth: Payer: Self-pay

## 2024-02-22 NOTE — Telephone Encounter (Signed)
 Via Baxter Bott [facility] Spanish Interpreter), attempted to contact patient regarding lab results, left message on voicemail requesting a return call.

## 2024-02-22 NOTE — Telephone Encounter (Signed)
 Using [address], [place], [place]: [id number], I have left a message for this pt requesting a return call for her test results. Callback number was provided.

## 2024-02-25 ENCOUNTER — Telehealth: Payer: Self-pay

## 2024-02-25 NOTE — Telephone Encounter (Addendum)
 Via, Baxter Bott, [facility] Spanish Interpreter, returned call, Patient informed negative Pap/HPV results, repeat pap in  3 years, and Breast discharge pathology was benign, per Dr. Dodie Frees no further follow-up needed, warm compresses and NSAID as needed for pain. Patient verbalized understanding.    Attempted to contact patient regarding lab results. Via Baxter Bott, [facility] Spanish Interpreter, left message on voicemail requesting a return call.
